# Patient Record
Sex: Female | Born: 1980 | Race: White | Hispanic: No | Marital: Married | Smoking: Former smoker
Health system: Southern US, Community
[De-identification: ages and names within clinical notes are randomized; demographics above are authoritative.]

## PROBLEM LIST (undated history)

## (undated) DIAGNOSIS — Z8632 Personal history of gestational diabetes: Secondary | ICD-10-CM

## (undated) DIAGNOSIS — F988 Other specified behavioral and emotional disorders with onset usually occurring in childhood and adolescence: Secondary | ICD-10-CM

## (undated) DIAGNOSIS — F419 Anxiety disorder, unspecified: Secondary | ICD-10-CM

## (undated) HISTORY — PX: OTHER SURGICAL HISTORY: SHX169

## (undated) HISTORY — DX: Personal history of gestational diabetes: Z86.32

## (undated) HISTORY — PX: WISDOM TOOTH EXTRACTION: SHX21

---
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
## 2002-09-16 ENCOUNTER — Other Ambulatory Visit: Admission: RE | Admit: 2002-09-16 | Discharge: 2002-09-16 | Payer: Self-pay | Admitting: Obstetrics and Gynecology

## 2003-11-16 ENCOUNTER — Other Ambulatory Visit: Admission: RE | Admit: 2003-11-16 | Discharge: 2003-11-16 | Payer: Self-pay | Admitting: Obstetrics and Gynecology

## 2004-11-18 ENCOUNTER — Other Ambulatory Visit: Admission: RE | Admit: 2004-11-18 | Discharge: 2004-11-18 | Payer: Self-pay | Admitting: Obstetrics and Gynecology

## 2005-12-11 ENCOUNTER — Other Ambulatory Visit: Admission: RE | Admit: 2005-12-11 | Discharge: 2005-12-11 | Payer: Self-pay | Admitting: Obstetrics and Gynecology

## 2008-09-21 ENCOUNTER — Ambulatory Visit (HOSPITAL_COMMUNITY): Admission: RE | Admit: 2008-09-21 | Discharge: 2008-09-21 | Payer: Self-pay | Admitting: Obstetrics and Gynecology

## 2008-10-19 ENCOUNTER — Ambulatory Visit (HOSPITAL_COMMUNITY): Admission: RE | Admit: 2008-10-19 | Discharge: 2008-10-19 | Payer: Self-pay | Admitting: Obstetrics and Gynecology

## 2008-10-24 ENCOUNTER — Encounter: Admission: RE | Admit: 2008-10-24 | Discharge: 2008-10-24 | Payer: Self-pay | Admitting: Obstetrics and Gynecology

## 2008-11-16 ENCOUNTER — Ambulatory Visit (HOSPITAL_COMMUNITY): Admission: RE | Admit: 2008-11-16 | Discharge: 2008-11-16 | Payer: Self-pay | Admitting: Obstetrics and Gynecology

## 2008-12-27 ENCOUNTER — Inpatient Hospital Stay (HOSPITAL_COMMUNITY): Admission: AD | Admit: 2008-12-27 | Discharge: 2009-01-01 | Payer: Self-pay | Admitting: Obstetrics and Gynecology

## 2010-10-10 ENCOUNTER — Ambulatory Visit (HOSPITAL_COMMUNITY)
Admission: RE | Admit: 2010-10-10 | Discharge: 2010-10-10 | Payer: Self-pay | Source: Home / Self Care | Attending: Obstetrics and Gynecology | Admitting: Obstetrics and Gynecology

## 2010-10-30 ENCOUNTER — Encounter
Admission: RE | Admit: 2010-10-30 | Discharge: 2010-11-05 | Payer: Self-pay | Source: Home / Self Care | Attending: Obstetrics and Gynecology | Admitting: Obstetrics and Gynecology

## 2010-10-31 ENCOUNTER — Other Ambulatory Visit (HOSPITAL_COMMUNITY): Payer: Self-pay | Admitting: Maternal and Fetal Medicine

## 2010-10-31 ENCOUNTER — Ambulatory Visit (HOSPITAL_COMMUNITY)
Admission: RE | Admit: 2010-10-31 | Discharge: 2010-10-31 | Payer: Self-pay | Source: Home / Self Care | Attending: Obstetrics and Gynecology | Admitting: Obstetrics and Gynecology

## 2010-10-31 DIAGNOSIS — O358XX0 Maternal care for other (suspected) fetal abnormality and damage, not applicable or unspecified: Secondary | ICD-10-CM

## 2010-10-31 DIAGNOSIS — O34219 Maternal care for unspecified type scar from previous cesarean delivery: Secondary | ICD-10-CM

## 2010-11-04 ENCOUNTER — Other Ambulatory Visit (HOSPITAL_COMMUNITY): Payer: Self-pay | Admitting: Maternal and Fetal Medicine

## 2010-11-04 ENCOUNTER — Ambulatory Visit (HOSPITAL_COMMUNITY)
Admission: RE | Admit: 2010-11-04 | Discharge: 2010-11-04 | Payer: Self-pay | Source: Home / Self Care | Attending: Obstetrics and Gynecology | Admitting: Obstetrics and Gynecology

## 2010-11-04 DIAGNOSIS — O34219 Maternal care for unspecified type scar from previous cesarean delivery: Secondary | ICD-10-CM

## 2010-11-04 DIAGNOSIS — IMO0001 Reserved for inherently not codable concepts without codable children: Secondary | ICD-10-CM

## 2010-11-04 DIAGNOSIS — O358XX0 Maternal care for other (suspected) fetal abnormality and damage, not applicable or unspecified: Secondary | ICD-10-CM

## 2010-11-06 ENCOUNTER — Ambulatory Visit (HOSPITAL_COMMUNITY)
Admission: RE | Admit: 2010-11-06 | Discharge: 2010-11-06 | Disposition: A | Discharge status: Home / Self Care | Payer: Commercial Managed Care - PPO | Source: Ambulatory Visit | Attending: Maternal and Fetal Medicine | Admitting: Maternal and Fetal Medicine

## 2010-11-06 ENCOUNTER — Other Ambulatory Visit (HOSPITAL_COMMUNITY): Payer: Self-pay

## 2010-11-06 DIAGNOSIS — O34219 Maternal care for unspecified type scar from previous cesarean delivery: Secondary | ICD-10-CM

## 2010-11-06 DIAGNOSIS — O358XX0 Maternal care for other (suspected) fetal abnormality and damage, not applicable or unspecified: Secondary | ICD-10-CM

## 2010-11-06 DIAGNOSIS — Z3689 Encounter for other specified antenatal screening: Secondary | ICD-10-CM | POA: Insufficient documentation

## 2010-11-07 ENCOUNTER — Ambulatory Visit (HOSPITAL_COMMUNITY): Payer: Self-pay

## 2010-11-07 ENCOUNTER — Other Ambulatory Visit (HOSPITAL_COMMUNITY): Payer: Self-pay

## 2010-11-08 ENCOUNTER — Ambulatory Visit (HOSPITAL_COMMUNITY)
Admission: RE | Admit: 2010-11-08 | Discharge: 2010-11-08 | Disposition: A | Discharge status: Home / Self Care | Payer: 59 | Source: Ambulatory Visit | Attending: Maternal and Fetal Medicine | Admitting: Maternal and Fetal Medicine

## 2010-11-08 ENCOUNTER — Other Ambulatory Visit (HOSPITAL_COMMUNITY): Payer: Self-pay | Admitting: Maternal and Fetal Medicine

## 2010-11-08 ENCOUNTER — Ambulatory Visit (HOSPITAL_COMMUNITY): Admission: RE | Admit: 2010-11-08 | Payer: Self-pay | Source: Ambulatory Visit

## 2010-11-08 DIAGNOSIS — O34219 Maternal care for unspecified type scar from previous cesarean delivery: Secondary | ICD-10-CM | POA: Insufficient documentation

## 2010-11-08 DIAGNOSIS — O358XX0 Maternal care for other (suspected) fetal abnormality and damage, not applicable or unspecified: Secondary | ICD-10-CM

## 2010-11-08 DIAGNOSIS — Z3689 Encounter for other specified antenatal screening: Secondary | ICD-10-CM | POA: Insufficient documentation

## 2010-11-11 ENCOUNTER — Ambulatory Visit (HOSPITAL_COMMUNITY)
Admission: RE | Admit: 2010-11-11 | Discharge: 2010-11-11 | Disposition: A | Discharge status: Home / Self Care | Payer: 59 | Source: Ambulatory Visit | Attending: Maternal and Fetal Medicine | Admitting: Maternal and Fetal Medicine

## 2010-11-11 DIAGNOSIS — O34219 Maternal care for unspecified type scar from previous cesarean delivery: Secondary | ICD-10-CM | POA: Insufficient documentation

## 2010-11-11 DIAGNOSIS — O358XX0 Maternal care for other (suspected) fetal abnormality and damage, not applicable or unspecified: Secondary | ICD-10-CM

## 2010-11-11 DIAGNOSIS — Z3689 Encounter for other specified antenatal screening: Secondary | ICD-10-CM | POA: Insufficient documentation

## 2010-11-13 ENCOUNTER — Ambulatory Visit (HOSPITAL_COMMUNITY)
Admission: RE | Admit: 2010-11-13 | Discharge: 2010-11-13 | Disposition: A | Discharge status: Home / Self Care | Payer: 59 | Source: Ambulatory Visit | Attending: Maternal and Fetal Medicine | Admitting: Maternal and Fetal Medicine

## 2010-11-13 DIAGNOSIS — O34219 Maternal care for unspecified type scar from previous cesarean delivery: Secondary | ICD-10-CM

## 2010-11-13 DIAGNOSIS — O358XX0 Maternal care for other (suspected) fetal abnormality and damage, not applicable or unspecified: Secondary | ICD-10-CM

## 2010-11-14 ENCOUNTER — Ambulatory Visit (HOSPITAL_COMMUNITY): Payer: Self-pay

## 2010-11-14 ENCOUNTER — Other Ambulatory Visit (HOSPITAL_COMMUNITY): Payer: Self-pay

## 2010-11-15 ENCOUNTER — Other Ambulatory Visit (HOSPITAL_COMMUNITY): Payer: Self-pay

## 2010-11-15 ENCOUNTER — Ambulatory Visit (HOSPITAL_COMMUNITY)
Admission: RE | Admit: 2010-11-15 | Discharge: 2010-11-15 | Disposition: A | Discharge status: Home / Self Care | Payer: 59 | Source: Ambulatory Visit | Attending: Maternal and Fetal Medicine | Admitting: Maternal and Fetal Medicine

## 2010-11-15 ENCOUNTER — Ambulatory Visit (HOSPITAL_COMMUNITY): Payer: Self-pay

## 2010-11-15 DIAGNOSIS — Z3689 Encounter for other specified antenatal screening: Secondary | ICD-10-CM | POA: Insufficient documentation

## 2010-11-15 DIAGNOSIS — O358XX0 Maternal care for other (suspected) fetal abnormality and damage, not applicable or unspecified: Secondary | ICD-10-CM

## 2010-11-15 DIAGNOSIS — O34219 Maternal care for unspecified type scar from previous cesarean delivery: Secondary | ICD-10-CM

## 2010-11-18 ENCOUNTER — Ambulatory Visit (HOSPITAL_COMMUNITY): Payer: Self-pay

## 2010-11-18 ENCOUNTER — Ambulatory Visit (HOSPITAL_COMMUNITY)
Admission: RE | Admit: 2010-11-18 | Discharge: 2010-11-18 | Disposition: A | Discharge status: Home / Self Care | Payer: 59 | Source: Ambulatory Visit | Attending: Maternal and Fetal Medicine | Admitting: Maternal and Fetal Medicine

## 2010-11-18 ENCOUNTER — Other Ambulatory Visit (HOSPITAL_COMMUNITY): Payer: Self-pay

## 2010-11-18 DIAGNOSIS — O358XX0 Maternal care for other (suspected) fetal abnormality and damage, not applicable or unspecified: Secondary | ICD-10-CM

## 2010-11-18 DIAGNOSIS — O99891 Other specified diseases and conditions complicating pregnancy: Secondary | ICD-10-CM | POA: Insufficient documentation

## 2010-11-18 DIAGNOSIS — O34219 Maternal care for unspecified type scar from previous cesarean delivery: Secondary | ICD-10-CM

## 2010-11-18 DIAGNOSIS — Z3689 Encounter for other specified antenatal screening: Secondary | ICD-10-CM | POA: Insufficient documentation

## 2010-11-20 ENCOUNTER — Ambulatory Visit (HOSPITAL_COMMUNITY)
Admission: RE | Admit: 2010-11-20 | Discharge: 2010-11-20 | Disposition: A | Discharge status: Home / Self Care | Payer: 59 | Source: Ambulatory Visit | Attending: Maternal and Fetal Medicine | Admitting: Maternal and Fetal Medicine

## 2010-11-20 ENCOUNTER — Other Ambulatory Visit (HOSPITAL_COMMUNITY): Payer: Self-pay | Admitting: Maternal and Fetal Medicine

## 2010-11-20 DIAGNOSIS — O34219 Maternal care for unspecified type scar from previous cesarean delivery: Secondary | ICD-10-CM

## 2010-11-20 DIAGNOSIS — Z3689 Encounter for other specified antenatal screening: Secondary | ICD-10-CM | POA: Insufficient documentation

## 2010-11-20 DIAGNOSIS — O358XX0 Maternal care for other (suspected) fetal abnormality and damage, not applicable or unspecified: Secondary | ICD-10-CM

## 2010-11-20 DIAGNOSIS — Z09 Encounter for follow-up examination after completed treatment for conditions other than malignant neoplasm: Secondary | ICD-10-CM

## 2010-11-22 ENCOUNTER — Ambulatory Visit (HOSPITAL_COMMUNITY)
Admission: RE | Admit: 2010-11-22 | Discharge: 2010-11-22 | Disposition: A | Discharge status: Home / Self Care | Payer: 59 | Source: Ambulatory Visit | Attending: Maternal and Fetal Medicine | Admitting: Maternal and Fetal Medicine

## 2010-11-22 ENCOUNTER — Encounter (HOSPITAL_COMMUNITY): Payer: Self-pay

## 2010-11-22 DIAGNOSIS — IMO0001 Reserved for inherently not codable concepts without codable children: Secondary | ICD-10-CM

## 2010-11-22 DIAGNOSIS — O34219 Maternal care for unspecified type scar from previous cesarean delivery: Secondary | ICD-10-CM

## 2010-11-22 DIAGNOSIS — O358XX0 Maternal care for other (suspected) fetal abnormality and damage, not applicable or unspecified: Secondary | ICD-10-CM | POA: Insufficient documentation

## 2010-11-22 DIAGNOSIS — Z3689 Encounter for other specified antenatal screening: Secondary | ICD-10-CM | POA: Insufficient documentation

## 2010-11-25 ENCOUNTER — Ambulatory Visit (HOSPITAL_COMMUNITY)
Admission: RE | Admit: 2010-11-25 | Discharge: 2010-11-25 | Disposition: A | Discharge status: Home / Self Care | Payer: 59 | Source: Ambulatory Visit | Attending: Maternal and Fetal Medicine | Admitting: Maternal and Fetal Medicine

## 2010-11-25 ENCOUNTER — Other Ambulatory Visit (HOSPITAL_COMMUNITY): Payer: Self-pay | Admitting: Maternal and Fetal Medicine

## 2010-11-25 DIAGNOSIS — Z09 Encounter for follow-up examination after completed treatment for conditions other than malignant neoplasm: Secondary | ICD-10-CM

## 2010-11-25 DIAGNOSIS — O358XX0 Maternal care for other (suspected) fetal abnormality and damage, not applicable or unspecified: Secondary | ICD-10-CM | POA: Insufficient documentation

## 2010-11-25 DIAGNOSIS — Z3689 Encounter for other specified antenatal screening: Secondary | ICD-10-CM | POA: Insufficient documentation

## 2010-11-26 ENCOUNTER — Other Ambulatory Visit (HOSPITAL_COMMUNITY): Payer: Commercial Managed Care - PPO

## 2010-11-27 ENCOUNTER — Other Ambulatory Visit (HOSPITAL_COMMUNITY): Payer: Commercial Managed Care - PPO

## 2010-11-29 ENCOUNTER — Ambulatory Visit (HOSPITAL_COMMUNITY)
Admission: RE | Admit: 2010-11-29 | Discharge: 2010-11-29 | Disposition: A | Discharge status: Home / Self Care | Payer: 59 | Source: Ambulatory Visit | Attending: Maternal and Fetal Medicine | Admitting: Maternal and Fetal Medicine

## 2010-11-29 DIAGNOSIS — Z09 Encounter for follow-up examination after completed treatment for conditions other than malignant neoplasm: Secondary | ICD-10-CM

## 2010-11-29 DIAGNOSIS — Z3689 Encounter for other specified antenatal screening: Secondary | ICD-10-CM | POA: Insufficient documentation

## 2010-11-29 DIAGNOSIS — O34219 Maternal care for unspecified type scar from previous cesarean delivery: Secondary | ICD-10-CM | POA: Insufficient documentation

## 2010-11-29 DIAGNOSIS — O358XX0 Maternal care for other (suspected) fetal abnormality and damage, not applicable or unspecified: Secondary | ICD-10-CM | POA: Insufficient documentation

## 2010-12-02 ENCOUNTER — Ambulatory Visit (HOSPITAL_COMMUNITY)
Admission: RE | Admit: 2010-12-02 | Discharge: 2010-12-02 | Disposition: A | Discharge status: Home / Self Care | Payer: 59 | Source: Ambulatory Visit | Attending: Maternal and Fetal Medicine | Admitting: Maternal and Fetal Medicine

## 2010-12-02 DIAGNOSIS — Z09 Encounter for follow-up examination after completed treatment for conditions other than malignant neoplasm: Secondary | ICD-10-CM

## 2010-12-02 DIAGNOSIS — Z3689 Encounter for other specified antenatal screening: Secondary | ICD-10-CM | POA: Insufficient documentation

## 2010-12-02 DIAGNOSIS — O358XX0 Maternal care for other (suspected) fetal abnormality and damage, not applicable or unspecified: Secondary | ICD-10-CM | POA: Insufficient documentation

## 2010-12-02 DIAGNOSIS — O34219 Maternal care for unspecified type scar from previous cesarean delivery: Secondary | ICD-10-CM | POA: Insufficient documentation

## 2010-12-03 ENCOUNTER — Other Ambulatory Visit (HOSPITAL_COMMUNITY): Payer: Commercial Managed Care - PPO

## 2010-12-04 ENCOUNTER — Ambulatory Visit (HOSPITAL_COMMUNITY)
Admission: RE | Admit: 2010-12-04 | Discharge: 2010-12-04 | Disposition: A | Discharge status: Home / Self Care | Payer: 59 | Source: Ambulatory Visit | Attending: Obstetrics and Gynecology | Admitting: Obstetrics and Gynecology

## 2010-12-04 DIAGNOSIS — Z3689 Encounter for other specified antenatal screening: Secondary | ICD-10-CM | POA: Insufficient documentation

## 2010-12-04 DIAGNOSIS — O34219 Maternal care for unspecified type scar from previous cesarean delivery: Secondary | ICD-10-CM | POA: Insufficient documentation

## 2010-12-04 DIAGNOSIS — Z09 Encounter for follow-up examination after completed treatment for conditions other than malignant neoplasm: Secondary | ICD-10-CM

## 2010-12-04 DIAGNOSIS — O358XX0 Maternal care for other (suspected) fetal abnormality and damage, not applicable or unspecified: Secondary | ICD-10-CM | POA: Insufficient documentation

## 2010-12-06 ENCOUNTER — Ambulatory Visit (HOSPITAL_COMMUNITY)
Admission: RE | Admit: 2010-12-06 | Discharge: 2010-12-06 | Disposition: A | Discharge status: Home / Self Care | Payer: 59 | Source: Ambulatory Visit | Attending: Obstetrics and Gynecology | Admitting: Obstetrics and Gynecology

## 2010-12-06 DIAGNOSIS — Z3689 Encounter for other specified antenatal screening: Secondary | ICD-10-CM | POA: Insufficient documentation

## 2010-12-06 DIAGNOSIS — O358XX0 Maternal care for other (suspected) fetal abnormality and damage, not applicable or unspecified: Secondary | ICD-10-CM | POA: Insufficient documentation

## 2010-12-06 DIAGNOSIS — O34219 Maternal care for unspecified type scar from previous cesarean delivery: Secondary | ICD-10-CM | POA: Insufficient documentation

## 2010-12-06 DIAGNOSIS — Z09 Encounter for follow-up examination after completed treatment for conditions other than malignant neoplasm: Secondary | ICD-10-CM

## 2010-12-09 ENCOUNTER — Ambulatory Visit (HOSPITAL_COMMUNITY)
Admission: RE | Admit: 2010-12-09 | Discharge: 2010-12-09 | Disposition: A | Discharge status: Home / Self Care | Payer: 59 | Source: Ambulatory Visit | Attending: Obstetrics and Gynecology | Admitting: Obstetrics and Gynecology

## 2010-12-09 DIAGNOSIS — Z09 Encounter for follow-up examination after completed treatment for conditions other than malignant neoplasm: Secondary | ICD-10-CM

## 2010-12-09 DIAGNOSIS — O34219 Maternal care for unspecified type scar from previous cesarean delivery: Secondary | ICD-10-CM | POA: Insufficient documentation

## 2010-12-09 DIAGNOSIS — Z3689 Encounter for other specified antenatal screening: Secondary | ICD-10-CM | POA: Insufficient documentation

## 2010-12-11 ENCOUNTER — Ambulatory Visit (HOSPITAL_COMMUNITY)
Admission: RE | Admit: 2010-12-11 | Discharge: 2010-12-11 | Disposition: A | Discharge status: Home / Self Care | Payer: 59 | Source: Ambulatory Visit | Attending: Obstetrics and Gynecology | Admitting: Obstetrics and Gynecology

## 2010-12-11 DIAGNOSIS — O34219 Maternal care for unspecified type scar from previous cesarean delivery: Secondary | ICD-10-CM | POA: Insufficient documentation

## 2010-12-11 DIAGNOSIS — Z3689 Encounter for other specified antenatal screening: Secondary | ICD-10-CM | POA: Insufficient documentation

## 2010-12-11 DIAGNOSIS — Z09 Encounter for follow-up examination after completed treatment for conditions other than malignant neoplasm: Secondary | ICD-10-CM

## 2010-12-13 ENCOUNTER — Other Ambulatory Visit (HOSPITAL_COMMUNITY): Payer: Self-pay | Admitting: Obstetrics and Gynecology

## 2010-12-13 ENCOUNTER — Ambulatory Visit (HOSPITAL_COMMUNITY)
Admission: RE | Admit: 2010-12-13 | Discharge: 2010-12-13 | Disposition: A | Discharge status: Home / Self Care | Payer: 59 | Source: Ambulatory Visit | Attending: Obstetrics and Gynecology | Admitting: Obstetrics and Gynecology

## 2010-12-13 DIAGNOSIS — Z09 Encounter for follow-up examination after completed treatment for conditions other than malignant neoplasm: Secondary | ICD-10-CM

## 2010-12-13 DIAGNOSIS — O358XX0 Maternal care for other (suspected) fetal abnormality and damage, not applicable or unspecified: Secondary | ICD-10-CM

## 2010-12-13 DIAGNOSIS — Z3689 Encounter for other specified antenatal screening: Secondary | ICD-10-CM | POA: Insufficient documentation

## 2010-12-13 DIAGNOSIS — O34219 Maternal care for unspecified type scar from previous cesarean delivery: Secondary | ICD-10-CM | POA: Insufficient documentation

## 2010-12-16 ENCOUNTER — Ambulatory Visit (HOSPITAL_COMMUNITY)
Admission: RE | Admit: 2010-12-16 | Discharge: 2010-12-16 | Disposition: A | Discharge status: Home / Self Care | Payer: 59 | Source: Ambulatory Visit | Attending: Obstetrics and Gynecology | Admitting: Obstetrics and Gynecology

## 2010-12-16 ENCOUNTER — Ambulatory Visit (HOSPITAL_COMMUNITY): Payer: Commercial Managed Care - PPO

## 2010-12-16 DIAGNOSIS — O34219 Maternal care for unspecified type scar from previous cesarean delivery: Secondary | ICD-10-CM | POA: Insufficient documentation

## 2010-12-16 DIAGNOSIS — O358XX0 Maternal care for other (suspected) fetal abnormality and damage, not applicable or unspecified: Secondary | ICD-10-CM | POA: Insufficient documentation

## 2010-12-17 ENCOUNTER — Other Ambulatory Visit (HOSPITAL_COMMUNITY): Payer: Commercial Managed Care - PPO

## 2010-12-18 ENCOUNTER — Ambulatory Visit (HOSPITAL_COMMUNITY)
Admission: RE | Admit: 2010-12-18 | Discharge: 2010-12-18 | Disposition: A | Discharge status: Home / Self Care | Payer: 59 | Source: Ambulatory Visit | Attending: Obstetrics and Gynecology | Admitting: Obstetrics and Gynecology

## 2010-12-18 ENCOUNTER — Other Ambulatory Visit (HOSPITAL_COMMUNITY): Payer: Commercial Managed Care - PPO

## 2010-12-18 ENCOUNTER — Other Ambulatory Visit: Payer: Commercial Managed Care - PPO

## 2010-12-18 ENCOUNTER — Encounter (HOSPITAL_COMMUNITY): Payer: 59

## 2010-12-18 DIAGNOSIS — O34219 Maternal care for unspecified type scar from previous cesarean delivery: Secondary | ICD-10-CM | POA: Insufficient documentation

## 2010-12-18 DIAGNOSIS — O358XX0 Maternal care for other (suspected) fetal abnormality and damage, not applicable or unspecified: Secondary | ICD-10-CM | POA: Insufficient documentation

## 2010-12-18 DIAGNOSIS — Z3689 Encounter for other specified antenatal screening: Secondary | ICD-10-CM | POA: Insufficient documentation

## 2010-12-18 DIAGNOSIS — Z09 Encounter for follow-up examination after completed treatment for conditions other than malignant neoplasm: Secondary | ICD-10-CM

## 2010-12-20 ENCOUNTER — Other Ambulatory Visit (HOSPITAL_COMMUNITY): Payer: Self-pay | Admitting: Obstetrics and Gynecology

## 2010-12-20 ENCOUNTER — Ambulatory Visit (HOSPITAL_COMMUNITY)
Admission: RE | Admit: 2010-12-20 | Discharge: 2010-12-20 | Disposition: A | Discharge status: Home / Self Care | Payer: 59 | Source: Ambulatory Visit | Attending: Obstetrics and Gynecology | Admitting: Obstetrics and Gynecology

## 2010-12-20 ENCOUNTER — Other Ambulatory Visit (HOSPITAL_COMMUNITY): Payer: Commercial Managed Care - PPO

## 2010-12-20 DIAGNOSIS — O2690 Pregnancy related conditions, unspecified, unspecified trimester: Secondary | ICD-10-CM

## 2010-12-20 DIAGNOSIS — O34219 Maternal care for unspecified type scar from previous cesarean delivery: Secondary | ICD-10-CM | POA: Insufficient documentation

## 2010-12-20 DIAGNOSIS — O358XX0 Maternal care for other (suspected) fetal abnormality and damage, not applicable or unspecified: Secondary | ICD-10-CM | POA: Insufficient documentation

## 2010-12-20 DIAGNOSIS — Z3689 Encounter for other specified antenatal screening: Secondary | ICD-10-CM | POA: Insufficient documentation

## 2010-12-23 ENCOUNTER — Encounter (HOSPITAL_COMMUNITY)
Admission: RE | Admit: 2010-12-23 | Discharge: 2010-12-23 | Disposition: A | Discharge status: Home / Self Care | Payer: 59 | Source: Ambulatory Visit | Attending: Obstetrics and Gynecology | Admitting: Obstetrics and Gynecology

## 2010-12-23 ENCOUNTER — Other Ambulatory Visit (HOSPITAL_COMMUNITY): Payer: Commercial Managed Care - PPO

## 2010-12-23 DIAGNOSIS — O34219 Maternal care for unspecified type scar from previous cesarean delivery: Secondary | ICD-10-CM | POA: Insufficient documentation

## 2010-12-23 DIAGNOSIS — O358XX0 Maternal care for other (suspected) fetal abnormality and damage, not applicable or unspecified: Secondary | ICD-10-CM | POA: Insufficient documentation

## 2010-12-23 DIAGNOSIS — Z3689 Encounter for other specified antenatal screening: Secondary | ICD-10-CM | POA: Insufficient documentation

## 2010-12-23 DIAGNOSIS — O2690 Pregnancy related conditions, unspecified, unspecified trimester: Secondary | ICD-10-CM

## 2010-12-25 ENCOUNTER — Encounter (HOSPITAL_COMMUNITY)
Admission: RE | Admit: 2010-12-25 | Discharge: 2010-12-25 | Disposition: A | Discharge status: Home / Self Care | Payer: 59 | Source: Ambulatory Visit | Attending: Obstetrics and Gynecology | Admitting: Obstetrics and Gynecology

## 2010-12-25 ENCOUNTER — Encounter (HOSPITAL_COMMUNITY): Payer: 59

## 2010-12-25 ENCOUNTER — Ambulatory Visit (HOSPITAL_COMMUNITY): Payer: Commercial Managed Care - PPO

## 2010-12-25 ENCOUNTER — Other Ambulatory Visit (HOSPITAL_COMMUNITY): Payer: Commercial Managed Care - PPO

## 2010-12-25 DIAGNOSIS — Z01812 Encounter for preprocedural laboratory examination: Secondary | ICD-10-CM | POA: Insufficient documentation

## 2010-12-25 DIAGNOSIS — O358XX0 Maternal care for other (suspected) fetal abnormality and damage, not applicable or unspecified: Secondary | ICD-10-CM

## 2010-12-25 LAB — CBC
HCT: 40.2 % (ref 36.0–46.0)
Hemoglobin: 13.6 g/dL (ref 12.0–15.0)
WBC: 11.7 10*3/uL — ABNORMAL HIGH (ref 4.0–10.5)

## 2010-12-25 LAB — BASIC METABOLIC PANEL
CO2: 19 mEq/L (ref 19–32)
Glucose, Bld: 102 mg/dL — ABNORMAL HIGH (ref 70–99)
Potassium: 3.5 mEq/L (ref 3.5–5.1)
Sodium: 135 mEq/L (ref 135–145)

## 2010-12-25 LAB — RPR: RPR Ser Ql: NONREACTIVE

## 2010-12-27 ENCOUNTER — Encounter (HOSPITAL_COMMUNITY)
Admission: RE | Admit: 2010-12-27 | Discharge: 2010-12-27 | Disposition: A | Discharge status: Home / Self Care | Payer: 59 | Source: Ambulatory Visit | Attending: Obstetrics and Gynecology | Admitting: Obstetrics and Gynecology

## 2010-12-27 ENCOUNTER — Other Ambulatory Visit (HOSPITAL_COMMUNITY): Payer: Commercial Managed Care - PPO

## 2010-12-27 DIAGNOSIS — O2690 Pregnancy related conditions, unspecified, unspecified trimester: Secondary | ICD-10-CM

## 2010-12-27 DIAGNOSIS — O34219 Maternal care for unspecified type scar from previous cesarean delivery: Secondary | ICD-10-CM

## 2010-12-28 LAB — MRSA CULTURE

## 2010-12-30 ENCOUNTER — Encounter (HOSPITAL_COMMUNITY): Payer: 59

## 2010-12-30 ENCOUNTER — Inpatient Hospital Stay (HOSPITAL_COMMUNITY)
Admission: RE | Admit: 2010-12-30 | Discharge: 2011-01-02 | DRG: 766 | Disposition: A | Discharge status: Home / Self Care | Payer: 59 | Source: Ambulatory Visit | Attending: Obstetrics and Gynecology | Admitting: Obstetrics and Gynecology

## 2010-12-30 DIAGNOSIS — Z01812 Encounter for preprocedural laboratory examination: Secondary | ICD-10-CM

## 2010-12-30 DIAGNOSIS — Z01818 Encounter for other preprocedural examination: Secondary | ICD-10-CM

## 2010-12-30 DIAGNOSIS — O34219 Maternal care for unspecified type scar from previous cesarean delivery: Principal | ICD-10-CM | POA: Diagnosis present

## 2010-12-30 LAB — ABO/RH: ABO/RH(D): O POS

## 2010-12-31 LAB — CBC
Hemoglobin: 13 g/dL (ref 12.0–15.0)
MCH: 28.3 pg (ref 26.0–34.0)
MCV: 87.4 fL (ref 78.0–100.0)
RBC: 4.59 MIL/uL (ref 3.87–5.11)

## 2011-01-01 ENCOUNTER — Encounter (HOSPITAL_COMMUNITY): Payer: 59

## 2011-01-01 NOTE — H&P (Addendum)
Lindsay [name], [name] NO.:  [id number]  MEDICAL RECORD NO.:  [id number]           PATIENT TYPE:  O  LOCATION:  MFM                           FACILITY:  [facility]  PHYSICIAN:  Sherron Monday, MD        DATE OF BIRTH:  1981/05/20  DATE OF ADMISSION:  12/30/2010 DATE OF DISCHARGE:  12/02/2010                             HISTORY & PHYSICAL   ADMISSION DIAGNOSIS:  Intrauterine pregnancy at 37 weeks, umbilical vein varix, history of low transverse cesarean section desires a repeat.  PROCEDURE PLANNED:  Repeat low transverse cesarean section.  HISTORY OF PRESENT ILLNESS:  A 30 year old G2, P 1-0-0-1 at 37 plus weeks for repeat low transverse cesarean section secondary to umbilical vein varix as well as history of cesarean sections, desiring a repeat. The umbilical vein varix was discovered on scan with maternal fetal medicine and they have been monitoring it with 3 times weekly BPPs and by monitoring it has not enlarged in such.  She has also been complicated by gestational diabetes mellitus.  She states she has had good fetal movement.  No loss of fluid.  No vaginal bleeding and occasional contractions.  PAST MEDICAL HISTORY:  Significant for gestational diabetes at her previous pregnancy.  PAST SURGICAL HISTORY:  Significant for cesarean section and wisdom tooth extraction.  PAST OB/GYN HISTORY:  She has a G2, P1-0-0-1 with a term delivery of 57- week infant female, weighing 7 pounds 6 ounces via cesarean section and failure to progress due to the present pregnancy.  MEDICATIONS:  Include prenatal vitamins.  ALLERGIES:  No known drug allergies.  SOCIAL HISTORY:  Denies alcohol, tobacco or drug use.  She is married and works as a Buyer, retail.  FAMILY MEDICAL HISTORY:  Significant for breast cancer, heart disease, stroke, diabetes, hypertension and thyroid dysfunction.  PRENATAL LABORATORY DATA:  She is O+, antibody screen negative. Hemoglobin 14.1.   Pap smear within normal limits.  Rubella immune, RPR nonreactive, hepatitis B surface antigen negative, HIV negative, platelets 235,000.  Gonorrhea negative, Chlamydia negative.  First trimester screen within normal limits.  Carrier screen negative and AFP also within normal limits.  Glucola of 168 at 3-hour that was initially normal.  Follow up was elevated, I am not sure of the values.  She has had BPPs 8/8.  Her group B strep status is negative.  Her ultrasound revealed normal anatomy with followup female infant.  PHYSICAL EXAMINATION:  VITAL SIGNS:  She is afebrile.  Vital signs stable. GENERAL:  No apparent distress. CARDIOVASCULAR:  Regular rate and rhythm. LUNGS:  Clear to auscultation bilaterally. ABDOMEN:  Soft.  Fundus is nontender. EXTREMITIES:  Symmetric and nontender.  ASSESSMENT AND PLAN:  A 30 year old G2, P1-0-0-1 at 37 weeks for repeat low transverse cesarean section given umbilical vein varix and history of cesarean section.  She was understanding when we discussed the risks, benefits and alternatives of delivery.  Her pregnancy has been complicated by gestational diabetes, which has been fairly well controlled.     Sherron Monday, MD     JB/MEDQ  D:  12/27/2010  T:  12/27/2010  Job:  [id number]  Electronically Signed by Sherron Monday MD on 03/06/2011 10:38:11 AM

## 2011-01-01 NOTE — Op Note (Signed)
Lindsay [name], Lindsay [name]            ACCOUNT NO.:  [id number]  MEDICAL RECORD NO.:  [id number]           PATIENT TYPE:  I  LOCATION:  9147                          [facility]ACILITY:  [facility]  PHYSICIAN:  Sherron Monday, MD        DATE OF BIRTH:  [birth year]  DATE OF PROCEDURE:  12/30/2010 DATE OF DISCHARGE:                              OPERATIVE REPORT   PREOPERATIVE DIAGNOSES: 1. Intrauterine pregnancy at 37 plus weeks. 2. Umbilical vein varix. 3. History of low transverse cesarean section, desires repeat.  POSTOPERATIVE DIAGNOSES: 1. Intrauterine pregnancy at 37 plus weeks, delivered. 2. Umbilical vein varix. 3. History of low transverse cesarean section, desires repeat.  PROCEDURE:  Repeat low transverse cesarean section.  SURGEON:  Sherron Monday, MD  ASSISTANT:  Zenaida Niece, MD  ANESTHESIA:  Spinal.  FINDINGS:  Viable female infant at 9:23 a.m. with Apgars of 8 at 1 minute and 9 at 5 minutes and a weight of 6 pounds 15 ounces.  Normal uterus, tubes, and ovaries.  Placenta to disposal.  ESTIMATED BLOOD LOSS:  600 mL.  IV FLUIDS:  2000 mL.  URINE OUTPUT:  500 mL, clear urine at the end of the procedure.  COMPLICATIONS:  None.  DISPOSITION:  Stable to PACU.  DESCRIPTION OF PROCEDURE:  After informed consent was reviewed with the patient including risks, benefits, and alternatives of surgical procedures, she was transported to the OR where spinal anesthesia was induced and found to be adequate.  She was then prepped and draped in the normal sterile fashion.  A Foley catheter was sterilely placed.  A Pfannenstiel incision was made at the level of her previous Pfannenstiel, carried through the underlying layer of fascia sharply. The fascia was incised in the midline.  Incision was extended laterally with Mayo scissors.  Inferior aspect of the fascial incision was grasped with Kocher clamps, elevating the rectus muscles which were dissected off both bluntly and sharply.   Attention was then turned to the superior portion which in a similar fashion was tented up with Kocher clamps and rectus muscles were dissected off both bluntly and sharply.  Midline was easily identified.  The peritoneum was entered bluntly.  The Alexis skin retractor was placed after carefully making sure no bowel was entrapped. The uterus was explored.  The vesicouterine peritoneum was easily identified, tented up with smooth pickups, and bladder flap was created both digitally and sharply.  Uterus was incised in the transverse fashion.  Infant was delivered from vertex presentation.  Nose and mouth were suctioned on the field.  Cord was clamped and cut.  Infant was handed off to the awaiting pediatric staff.  Placenta was expressed, given to cord blood for cord blood collection.  The uterus was cleared of all clot and debris.  The uterine incision was closed in 2 layers of 0 Monocryl, the first of which is a running locked and second was an imbricating.  The pelvis was copiously irrigated.  Hemostasis was assured.  The Alexis skin retractor was removed.  The peritoneum was reapproximated using 2-0 Vicryl.  The fascia was closed after subfascial planes were inspected and found  to be hemostatic.  Fascia was closed with 0 Vicryl in a running fashion from each corner, overlapping in the midline.  The subcuticular adipose layer was made hemostatic with Bovie cautery, irrigated, and a suture of plain gut was used to close the dead space.  The skin was closed with staples.  The patient tolerated the procedure well.  Sponge, lap, and needle count was correct x2 at the end of the procedure.     Sherron Monday, MD     JB/MEDQ  D:  12/30/2010  T:  12/31/2010  Job:  [id number]  Electronically Signed by Sherron Monday MD on 01/01/2011 04:20:42 PM

## 2011-01-03 ENCOUNTER — Encounter (HOSPITAL_COMMUNITY): Payer: 59

## 2011-01-06 ENCOUNTER — Encounter (HOSPITAL_COMMUNITY): Payer: Commercial Managed Care - PPO

## 2011-01-08 ENCOUNTER — Encounter (HOSPITAL_COMMUNITY): Payer: Commercial Managed Care - PPO

## 2011-01-10 ENCOUNTER — Encounter (HOSPITAL_COMMUNITY): Payer: Commercial Managed Care - PPO

## 2011-01-13 ENCOUNTER — Encounter (HOSPITAL_COMMUNITY): Payer: Commercial Managed Care - PPO

## 2011-01-15 ENCOUNTER — Encounter (HOSPITAL_COMMUNITY): Payer: Commercial Managed Care - PPO

## 2011-01-16 LAB — COMPREHENSIVE METABOLIC PANEL
AST: 22 U/L (ref 0–37)
CO2: 18 mEq/L — ABNORMAL LOW (ref 19–32)
Calcium: 8.5 mg/dL (ref 8.4–10.5)
Creatinine, Ser: 0.64 mg/dL (ref 0.4–1.2)
GFR calc Af Amer: >60 mL/min (ref >60)
GFR calc non Af Amer: >60 mL/min (ref >60)

## 2011-01-16 LAB — GLUCOSE, CAPILLARY
Glucose-Capillary: 102 mg/dL — ABNORMAL HIGH (ref 70–99)
Glucose-Capillary: 72 mg/dL (ref 70–99)
Glucose-Capillary: 74 mg/dL (ref 70–99)
Glucose-Capillary: 82 mg/dL (ref 70–99)

## 2011-01-16 LAB — CBC
HCT: 37.5 % (ref 36.0–46.0)
Hemoglobin: 12.5 g/dL (ref 12.0–15.0)
Hemoglobin: 14.3 g/dL (ref 12.0–15.0)
MCHC: 32.9 g/dL (ref 30.0–36.0)
MCHC: 33.4 g/dL (ref 30.0–36.0)
MCV: 86.3 fL (ref 78.0–100.0)
MCV: 86.5 fL (ref 78.0–100.0)
Platelets: 226 10*3/uL (ref 150–400)
RBC: 4.26 MIL/uL (ref 3.87–5.11)
RBC: 4.98 MIL/uL (ref 3.87–5.11)
RBC: 5.01 MIL/uL (ref 3.87–5.11)
RDW: 14.9 % (ref 11.5–15.5)
WBC: 16.8 10*3/uL — ABNORMAL HIGH (ref 4.0–10.5)

## 2011-01-16 LAB — URIC ACID: Uric Acid, Serum: 6.5 mg/dL (ref 2.4–7.0)

## 2011-01-18 ENCOUNTER — Inpatient Hospital Stay (HOSPITAL_COMMUNITY): Admission: AD | Admit: 2011-01-18 | Payer: Self-pay | Source: Home / Self Care | Admitting: Obstetrics and Gynecology

## 2011-01-20 ENCOUNTER — Encounter (HOSPITAL_COMMUNITY): Payer: Commercial Managed Care - PPO

## 2011-01-22 ENCOUNTER — Encounter (HOSPITAL_COMMUNITY): Payer: Commercial Managed Care - PPO

## 2011-01-22 NOTE — Discharge Summary (Signed)
  NAMEKALYN, HOFSTRA            ACCOUNT NO.:  [id number]  MEDICAL RECORD NO.:  [id number]           PATIENT TYPE:  I  LOCATION:  9147                          [facility]ILITY:  [facility]  PHYSICIAN:  Sherron Monday, MD        DATE OF BIRTH:  [birth year]  DATE OF ADMISSION:  12/30/2010 DATE OF DISCHARGE:  01/02/2011                              DISCHARGE SUMMARY   ADMITTING DIAGNOSES: 1. Intrauterine pregnancy at 37 plus weeks. 2. Umbilical vein varix. 3. History of low-transverse cesarean section, desires repeat.  DISCHARGE DIAGNOSES: 1. Intrauterine pregnancy at 37 plus weeks, delivered. 2. Umbilical vein varix. 3. History of low-transverse cesarean section, desires repeat.  HISTORY OF PRESENT ILLNESS:  For the complete history and physical, please see the dictated note.  However, in brief, this is a 30 year old G2, P1-3-0-1 at 37 plus weeks for repeat low transverse cesarean section secondary to umbilical vein varix.  Her delivery is recommended per Maternal Fetal Medicine recommendations.  The patient has a history of a low transverse cesarean section and desires a repeat.  This was performed without difficulty, delivering a viable female infant at 9:23 on December 30, 2010, with Apgars of 8 at 1 minute and 9 at 5 minutes, and a weight of 6 pounds and 15 ounces.  Normal uterus, tubes, and ovaries are noted.  Her postoperative course was relatively uncomplicated.  She remained afebrile and vital signs were stable throughout.  Her hemoglobin decreased to 13.0.  Her postpartum course was relatively uncomplicated.  As mentioned, her baby did have to go to the NICU for some mild respiratory distress.  Baby was able to be discharged home with her mother on the night of January 02, 2011.  She desired discharge from hospital at this time.  She was discharged from the hospital with routine discharge instructions and numbers to call with any questions or problems as well as prescriptions for Motrin,  Percocet, and prenatal vitamins.  She will follow up in the office in approximately 2 weeks for an incision check.  Her staples were removed prior to discharge and the incision was clean, dry, and intact.     Sherron Monday, MD     JB/MEDQ  D:  01/03/2011  T:  01/03/2011  Job:  [id number]  Electronically Signed by Sherron Monday MD on 01/22/2011 04:29:58 PM

## 2011-02-18 NOTE — Discharge Summary (Signed)
[name], Lindsay [name]            ACCOUNT NO.:  [id number]   MEDICAL RECORD NO.:  [id number]          PATIENT TYPE:  INP   LOCATION:  9141                          [facility]ILITY:  [facility]   PHYSICIAN:  Sherron Monday, MD        DATE OF BIRTH:  March 21, 1981   DATE OF ADMISSION:  12/27/2008  DATE OF DISCHARGE:  01/01/2009                               DISCHARGE SUMMARY   ADMITTING DIAGNOSIS:  Intrauterine pregnancy at term for induction of  labor.   DISCHARGE DIAGNOSIS:  Intrauterine pregnancy at term for induction of  labor, delivered via low transverse cesarean section secondary to  failure to progress.   For a complete history and physical, please see the dictated admission H  and P.  In brief, this is a 30 year old G1, P0, for induction of labor  given pregnancy-induced hypertension as well as gestational diabetes  with an unfavorable cervix.  At the time of admission, it was discussed  with the patient, risks, benefits, and alternatives and possibility of a  cesarean section.  She voiced understanding to all this.  She progressed  in labor and had essentially no cervical change for approximately 8  hours at this point.  After the placement of an IUPC and adjusting her  Pitocin, she was unable to get adequate, but still largely had no  changes, but had originally changed up to 7-8 cm.  She discussed the  risks, benefits, and alternatives of the cesarean section, and she  wished to proceed.  This was performed on December 29, 2008, at  approximately 11 in the morning.  She delivered a viable female infant  at 10:24 with Apgars of 8 at 1-minute and 9 at 5 minutes, and weight was  7 pounds 8 ounces.  Normal uterus, tubes, and ovaries were noted.  EBL  was 500 mL.  Her postoperative course was relatively uncomplicated.  She  remained afebrile with stable vital signs throughout her course and was  discharged to home on postoperative day #3.  At this time, she was  ambulating well, tolerating p.o.,  and she had no complaints.  Her lochia  was within normal limits.  Her hemoglobin had decreased from 14.4-12.5.  Her sugars were followed in labor and a single fasting sugar was checked  and it was found to be within normal limits on December 30, 2008.  She was  discharged to home after staples were removed.  She was given routine  discharge instructions and numbers to call if any questions or problems  as well as routine instructions and prescriptions for Motrin, Vicodin,  and prenatal vitamins.  We will check a 2-hour GTT after a 6-week check.  She is O positive, rubella immune, hemoglobin 14.4-12.5.  She plans to  breast feed and will start Micronor at her 6 week checkup.       Sherron Monday, MD  Electronically Signed     JB/MEDQ  D:  01/01/2009  T:  01/01/2009  Job:  [id number]

## 2011-02-18 NOTE — Op Note (Signed)
NAMEPORSCHA, AXLEY NO.:  [id number]   MEDICAL RECORD NO.:  [id number]           PATIENT TYPE:   LOCATION:                                 FACILITY:   PHYSICIAN:  Sherron Monday, MD        DATE OF BIRTH:  October 01, 1981   DATE OF PROCEDURE:  12/29/2008  DATE OF DISCHARGE:                               OPERATIVE REPORT   PREOPERATIVE DIAGNOSIS:  Intrauterine pregnancy at term with arrest of  dilatation.   POSTOPERATIVE DIAGNOSIS:  Intrauterine pregnancy at term with arrest of  dilatation, delivered via low transverse cesarean section.   PROCEDURE:  Primary low transverse cesarean section.   ANESTHESIA:  Epidural.   SURGEON:  Sherron Monday, MD   FINDINGS:  Viable female infant at 10:24 a.m. with Apgars of 8 at 1 minute  and 9 at 5 minutes and a weight of 7 pounds 8 ounces.  Normal uterus,  tubes, and ovaries.   ESTIMATED BLOOD LOSS:  700 mL.   IV FLUIDS:  1800 mL.   URINE OUTPUT:  200 mL clear urine at the end of the procedure.   PATHOLOGY:  None.   COMPLICATIONS:  None.   DISPOSITION:  Stable to PACU.   PROCEDURE:  After informed consent was reviewed with the patient  including risks, benefits, alternatives of the surgical procedure, she  was transported in stable condition to the OR where her epidural was  dosed and found to be adequate.  She had been transferred to the table  in supine position with a leftward tilt.  The patient was prepped and  draped in the normal fashion.  A Pfannenstiel incision was made  approximately two 2 fingerbreadths above the pubic symphysis and carried  through the underlying layer of fascia sharply.  The fascia was incised,  and the incision was extended laterally with Mayo scissors.  The  inferior aspect of the fascial incision was grasped with Kocher clamps,  elevated, and the rectus muscles were dissected off both bluntly and  sharply.  The superior aspect of fascial incision was grasped with  Kocher clamps, elevated,  and the rectus muscles were dissected off both  bluntly and sharply.  Midline was easily identified, the peritoneum was  entered bluntly.  The incision was extended with good visuaklization.  The Alexis skin retractor was placed and carefully checked to make sure  no bowel was entrapped.  The uterus was incised in a transverse fashion.  A bladder flap was created.  The uterus was incised, and the infant was  delivered from a vertex presentation.  Nose and mouth were suctioned on  the field.  Cord was clamped and cut, and baby was handed off to the  awaiting pediatric staff.  The placenta was expressed and delivered.  The uterus was cleared of all clots and debris.  The uterine incision  was closed with 2 layers of 0 Monocryl, the first of which was running  locked and the second as an imbricating layer.  Hemostasis was assured.  The uterus was returned to the abdominal cavity.  Copious irrigation was  performed.  Hemostasis was assured at the incision, and the peritoneum  was closed with 2-0 Vicryl in a running fashion.  Subfascial planes were  inspected and found to be hemostatic.  The fascia was closed with 0  Vicryl in a running fashion.  The subcuticular adipose layer was made  hemostatic with Bovie cautery.  Irrigation was performed.  A 3-0 plain  gut suture was used to close the subcuticular space.  The skin was  reapproximated with staples.  The patient tolerated the procedure well.  Sponge, lap, and needle counts were correct x2 at the end of the  procedure.      Sherron Monday, MD  Electronically Signed     JB/MEDQ  D:  12/29/2008  T:  12/29/2008  Job:  [id number]

## 2011-02-18 NOTE — H&P (Signed)
NAMEZONNIQUE, NORKUS NO.:  [id number]   MEDICAL RECORD NO.:  [id number]          PATIENT TYPE:  MAT   LOCATION:                                [facility]   PHYSICIAN:  Sherron Monday, MD        DATE OF BIRTH:  [birth year]   DATE OF ADMISSION:  12/30/2008  DATE OF DISCHARGE:                              HISTORY & PHYSICAL   PROCEDURE:  Planned induction of labor with cervical ripening.   ADMISSION DIAGNOSES:  Intrauterine pregnancy at term with gestational  diabetes and pregnancy-induced hypertension.   HISTORY OF PRESENT ILLNESS:  This is a 30 year old Caucasian female, G1,  P0 for induction of labor, given pregnancy-induced hypertension as well  as gestational diabetes with an unfavorable cervix.  She will be given  Cervidil the night of admission for ripening of her cervix and plans  delivery the next day.  She voices understanding the risks, benefits,  and alternatives of this plan.   PAST MEDICAL HISTORY:  Insignificant.   PAST SURGICAL HISTORY:  Significant for wisdom tooth extraction.   PAST OBSTETRICAL AND GYNECOLOGICAL HISTORY:  G1 is a current pregnancy  with no abnormal Pap smears and sexually transmitted diseases.   MEDICATIONS:  Prenatal vitamins.   ALLERGIES:  No known drug allergies.   SOCIAL HISTORY:  She has a history of tobacco use and quit with the  pregnancy.  No alcohol or drug use in the pregnancy.  She is married.   FAMILY HISTORY:  Significant for heart disease in maternal grandmother,  had a stroke in maternal great grandmother with heart disease, chronic  hypertension in father, maternal grandmother, and mother.  Breast cancer  in maternal grandmother.  Father with diabetes.  Paternal grandfather,  maternal grandfather, maternal grandmother with diabetes.  Thyroid  dysfunction in maternal aunt.   PRENATAL LABORATORY DATA:  Hemoglobin 13.9, platelets 256,000.  RPR  nonreactive, rubella immune, hepatitis B surface antigen  negative, and  HIV negative.  Urine, no growth.  Pap smears are within normal limits.  She is O positive with antibody screen negative, gonorrhea negative, and  chlamydia negative.  Cystic fibrosis screen was negative.  First  trimester screen was negative.  AFP was negative.  Group B strep was  negative.  Ultrasound revealed normal anatomy, limited heart, and  limited face.  This was performed on August 01, 2008.  She had a normal  nuchal thickness and strength.  Followup ultrasound continued to have  limited anatomy revealed a female infant.  [facility], which revealed normal anatomy with a marginal  previa which with an ultrasound.  A followup ultrasound had resolved.  Her gestational diabetes screen was 207.  Her diabetes has been fairly  well controlled with 5 mg of glyburide daily.   PHYSICAL EXAMINATION:  VITAL SIGNS:  She is afebrile.  Vital signs are  stable.  GENERAL:  No apparent distress.  CARDIOVASCULAR:  Regular rate and rhythm.  LUNGS:  Clear to auscultation bilaterally.  ABDOMEN:  Soft.  Fundus is nontender.  EXTREMITIES:  Symmetric and  nontender.  PELVIC:  Fetal heart tones in the last visit were in the 150s with  occasional contractions.  Exam was closed, 80% effaced, and -2 station.   ASSESSMENT AND PLAN:  A 30 year old, G1, P0 for induction of labor,  starting with cervical ripening on the evening of the December 27, 2008.  This was discussed with the patient.  She agrees with the plan.      Sherron Monday, MD  Electronically Signed     JB/MEDQ  D:  12/25/2008  T:  12/26/2008  Job:  [id number]

## 2011-11-30 IMAGING — US US FETAL BPP W/O NONSTRESS
1 series · 14 of 16 positions shown · non-contrast
Comparison: none

[Series 1: us fetal bpp w/o nonstress · 0.32mm/px · 14 of 16 slices shown]
[im 1/16]
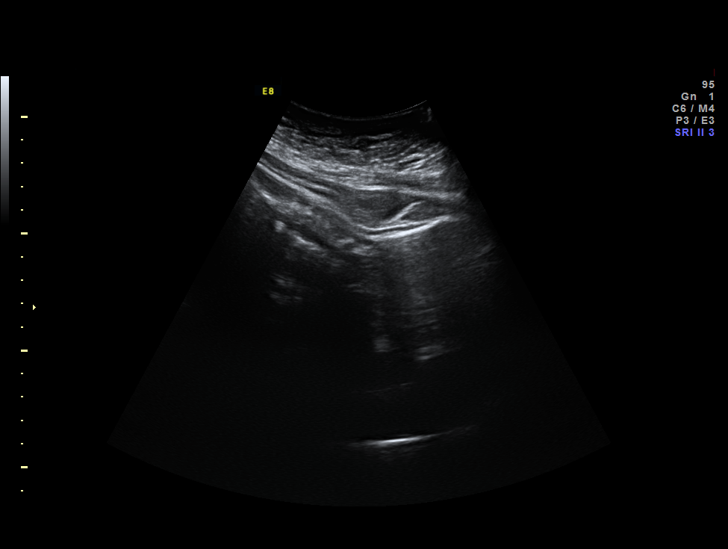
[im 2/16]
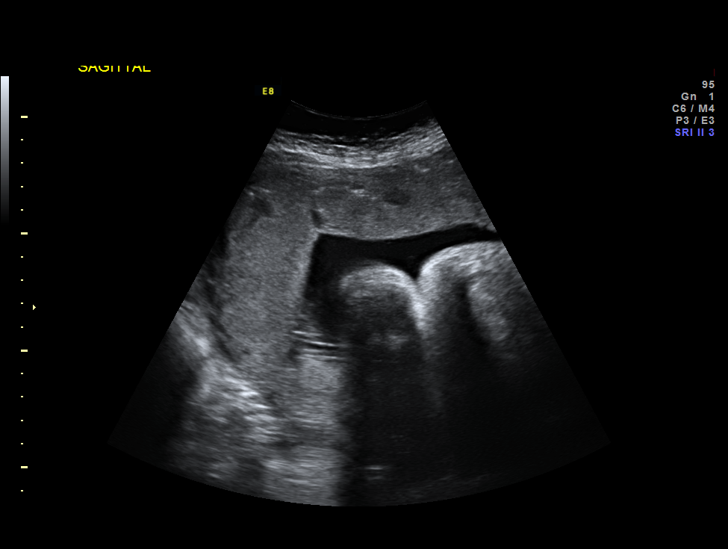
[im 3/16]
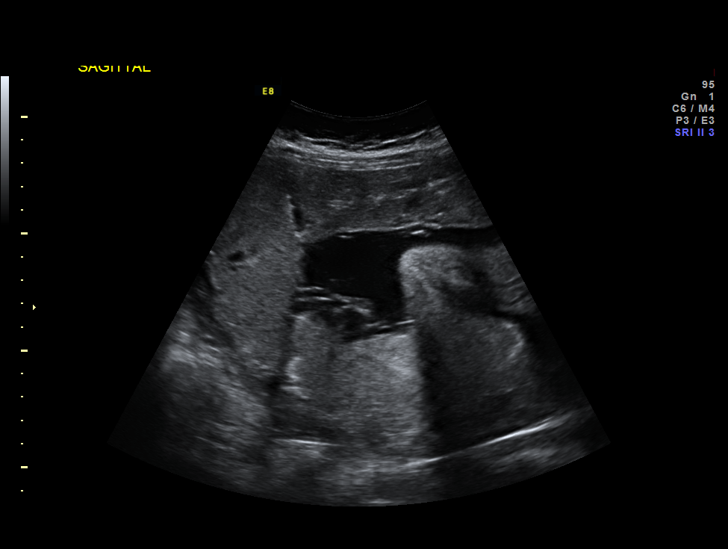
[im 5/16]
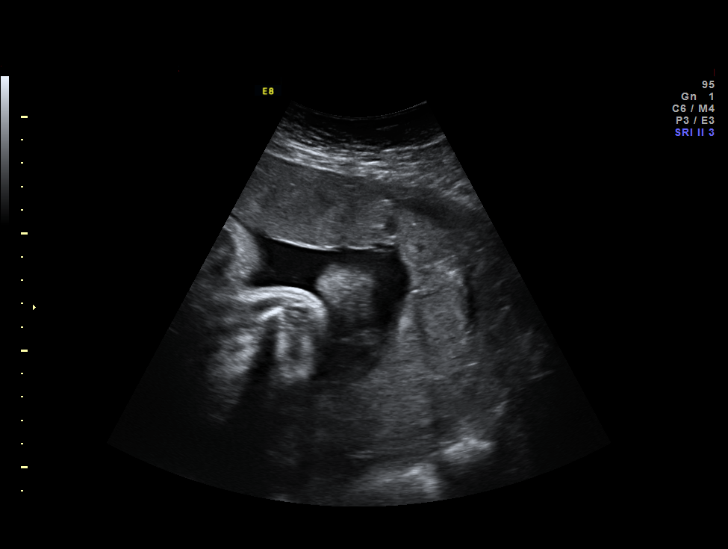
[im 6/16]
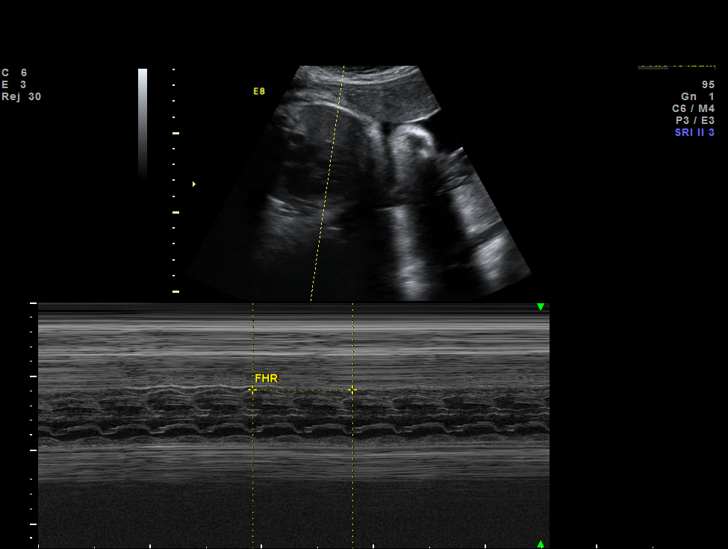
[im 7/16]
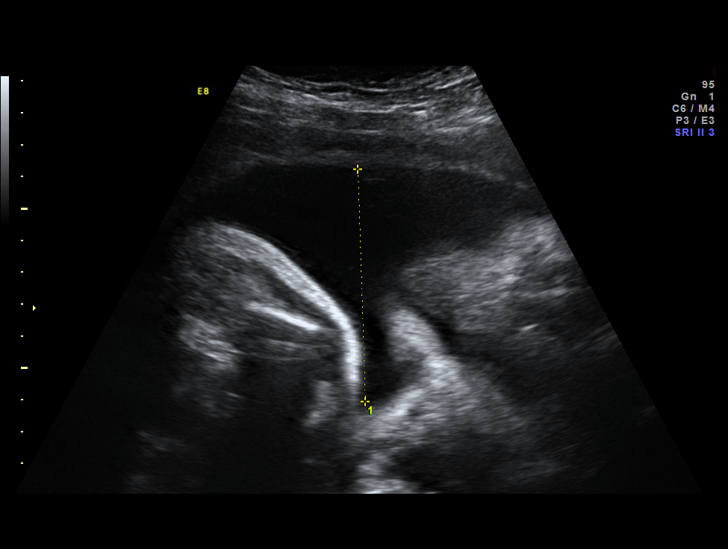
[im 8/16]
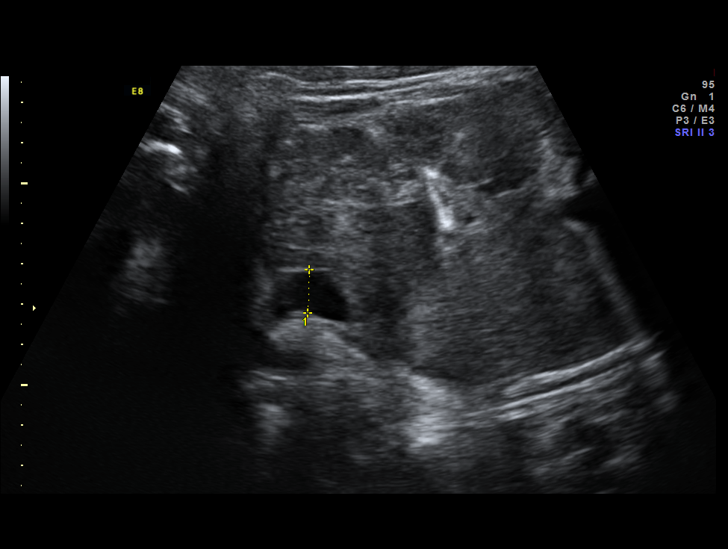
[im 9/16]
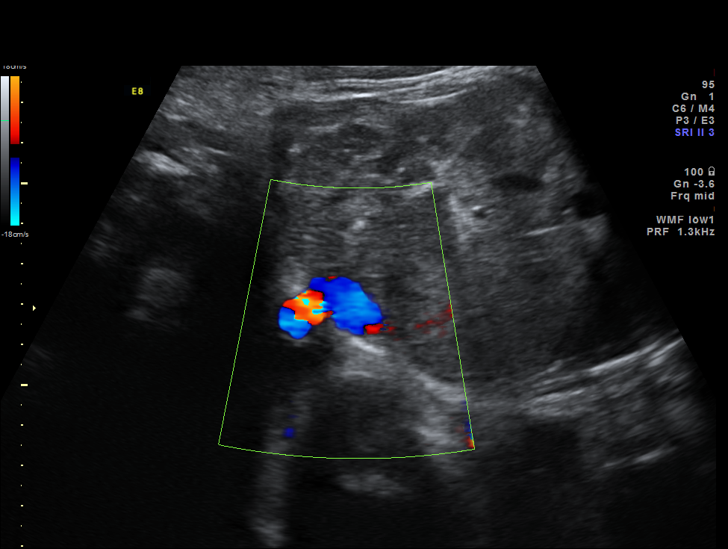
[im 10/16]
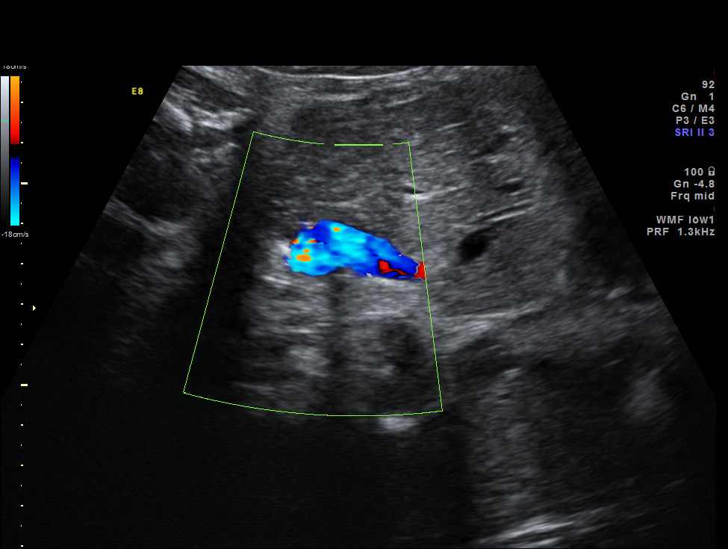
[im 11/16]
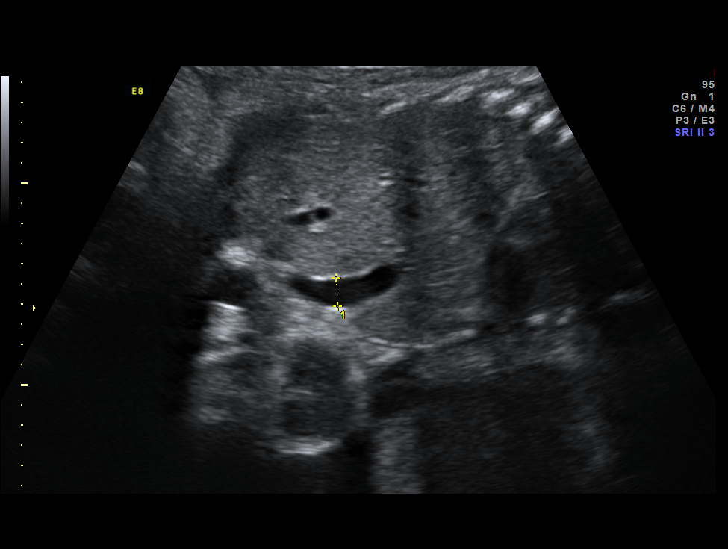
[im 13/16]
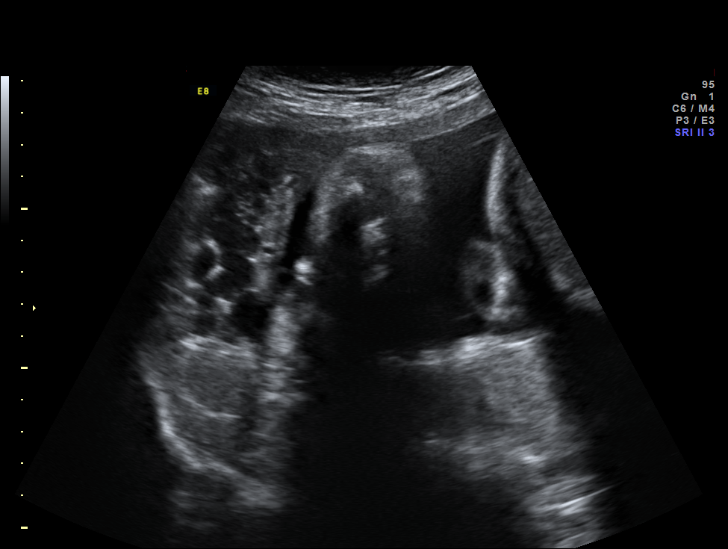
[im 14/16]
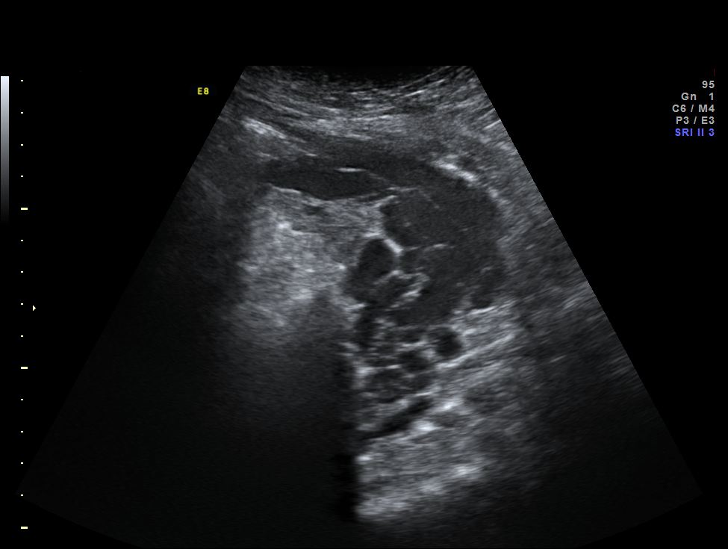
[im 15/16]
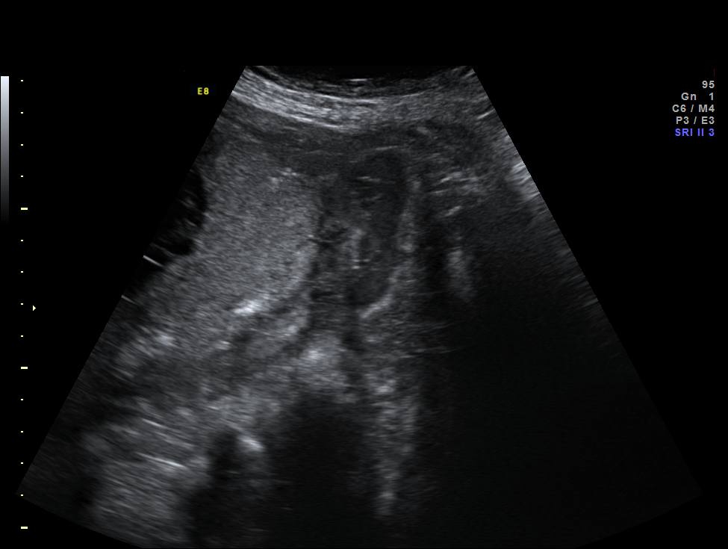
[im 16/16]
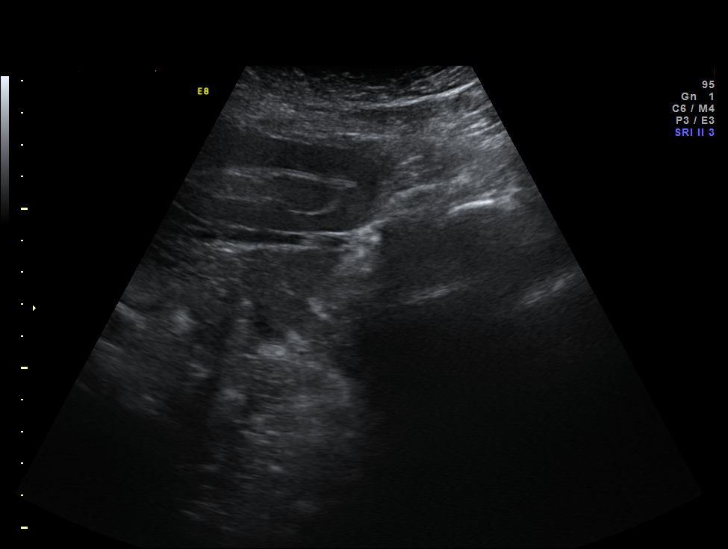

[14 of 16 positions shown; findings below may reference images not displayed]

Canned report from images found in remote index.

Refer to host system for actual result text.

## 2011-12-02 IMAGING — US US FETAL BPP W/O NONSTRESS
1 series · 14 of 17 positions shown · non-contrast
Comparison: none

[Series 1: us fetal bpp w/o nonstress · 0.22mm/px · 14 of 17 slices shown]
[im 1/17]
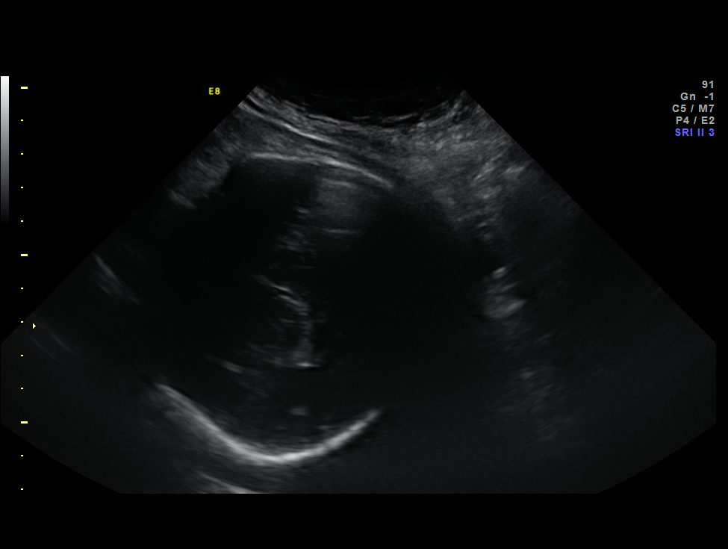
[im 2/17]
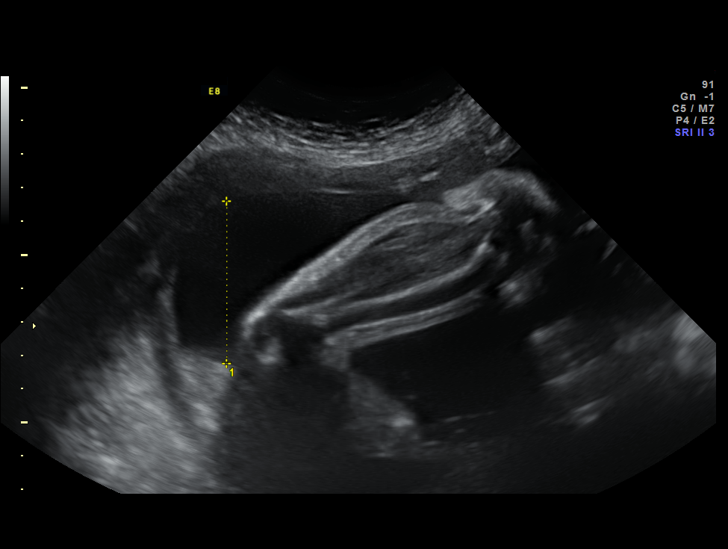
[im 4/17]
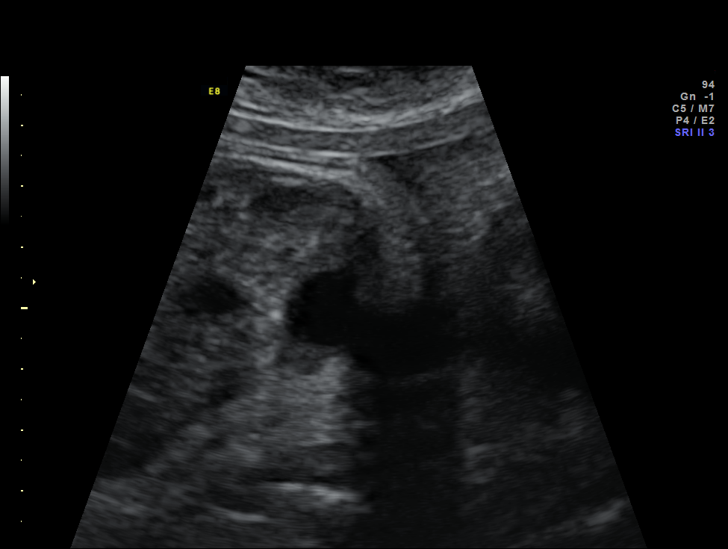
[im 5/17]
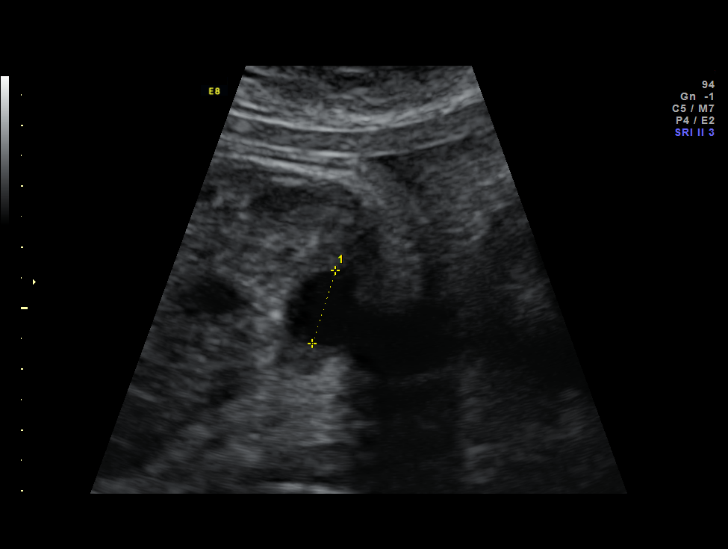
[im 6/17]
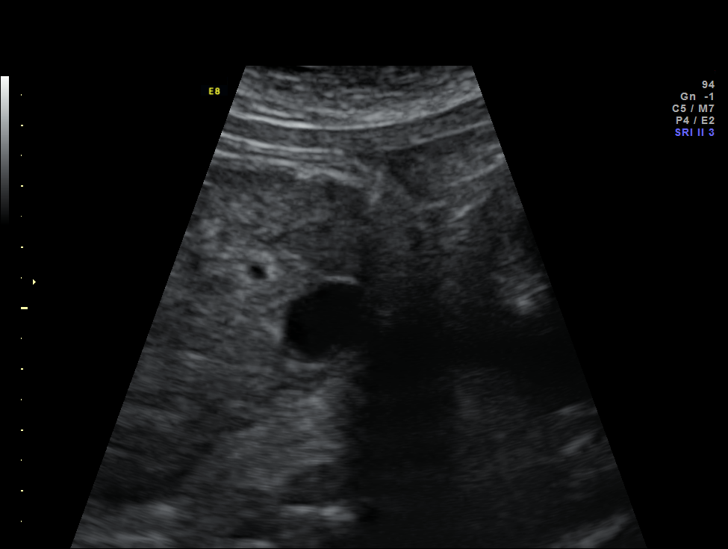
[im 7/17]
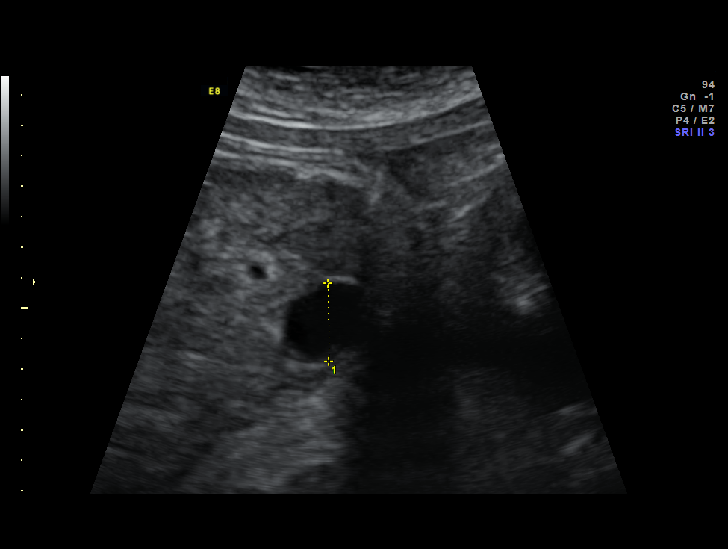
[im 8/17]
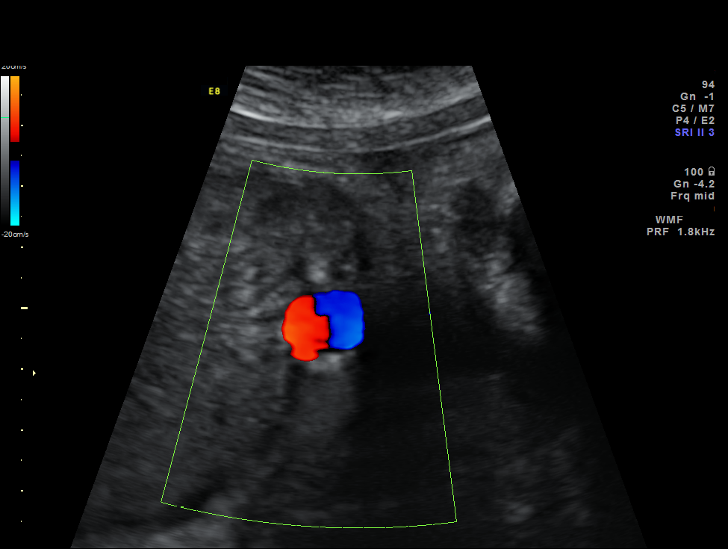
[im 10/17]
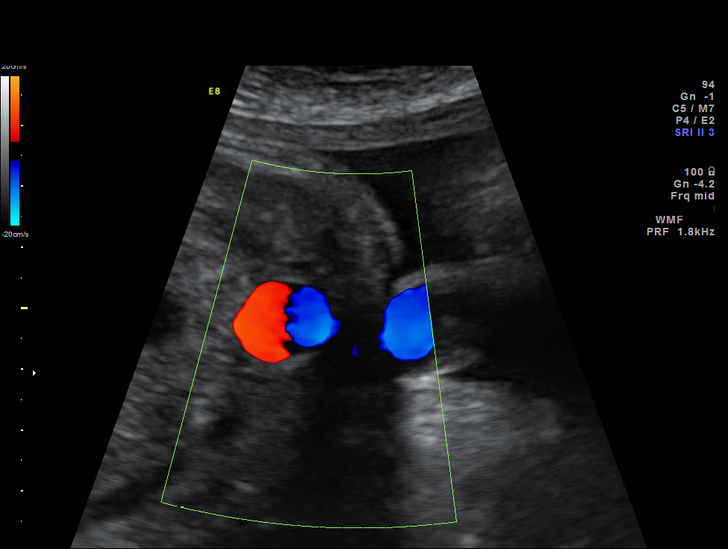
[im 11/17]
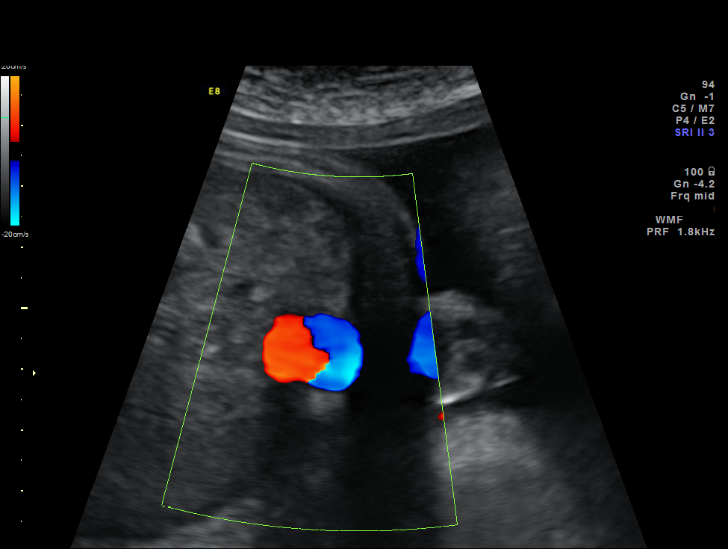
[im 12/17]
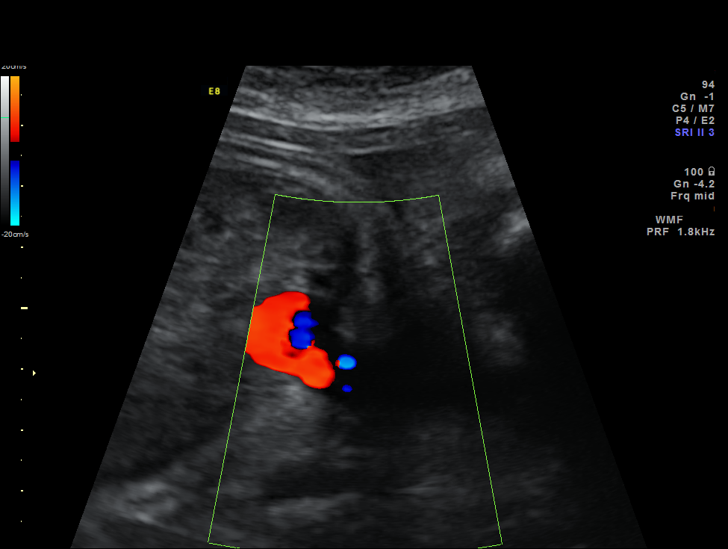
[im 13/17]
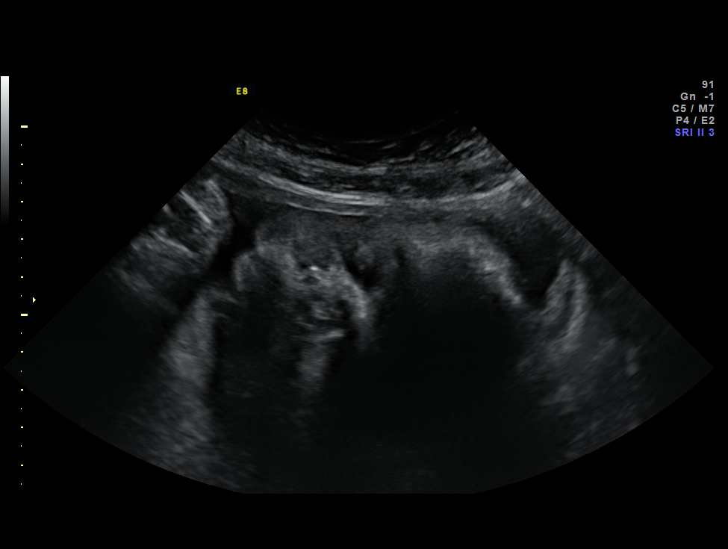
[im 14/17]
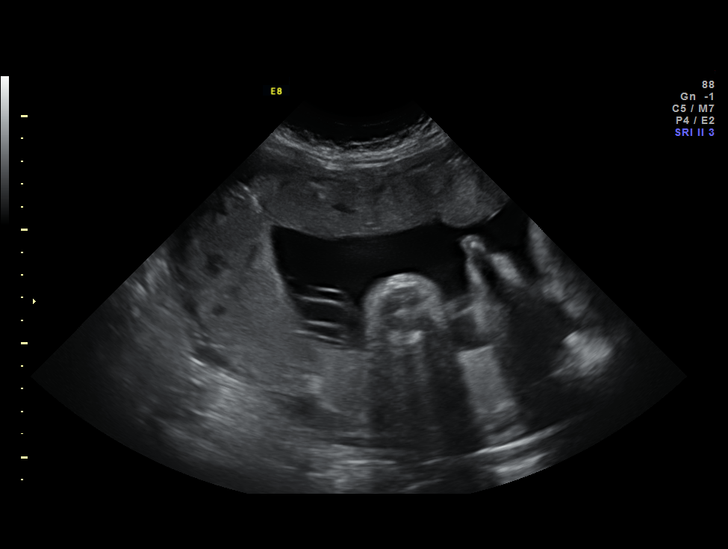
[im 16/17]
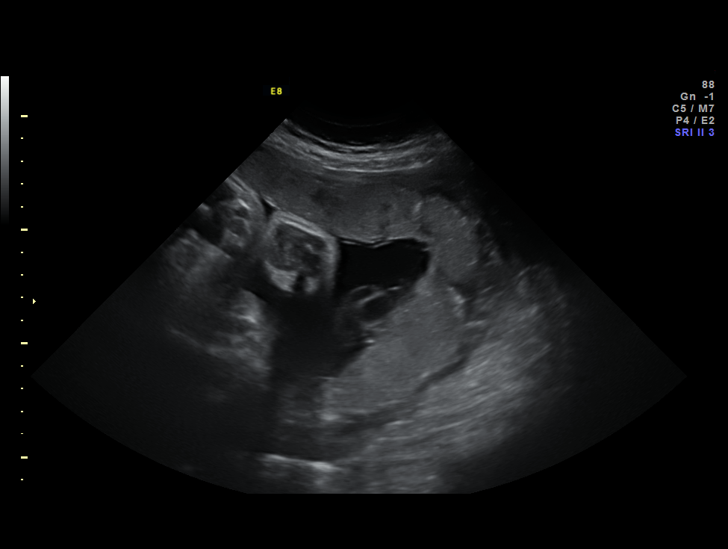
[im 17/17]
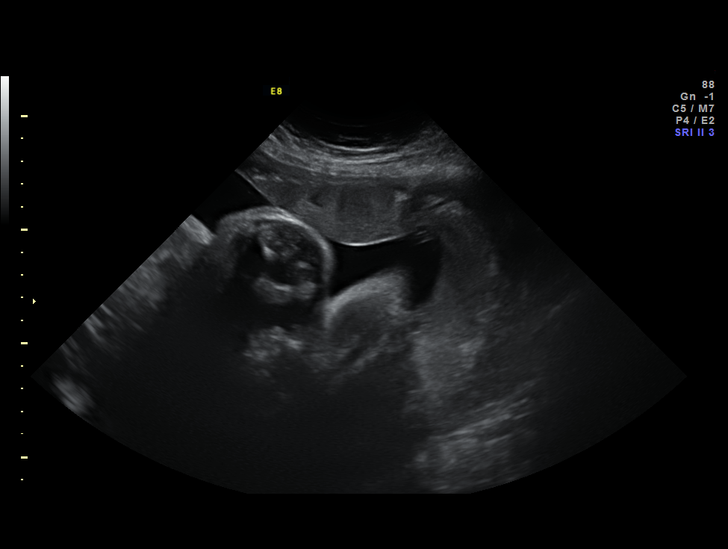

[14 of 17 positions shown; findings below may reference images not displayed]

Canned report from images found in remote index.

Refer to host system for actual result text.

## 2011-12-05 IMAGING — US US FETAL BPP W/O NONSTRESS
1 series · 14 of 22 positions shown · non-contrast
Comparison: none

[Series 1: us fetal bpp w/o nonstress · 0.23mm/px · 22 acquisitions, 14 frames shown]
[im 1/22]
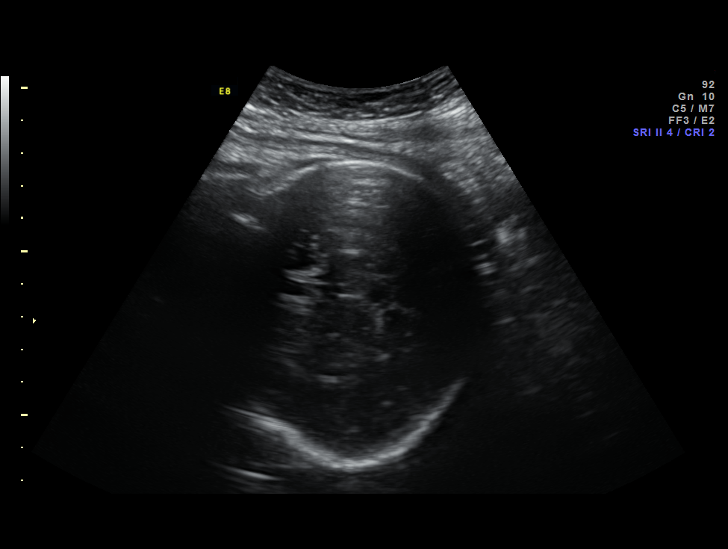
[im 3/22]
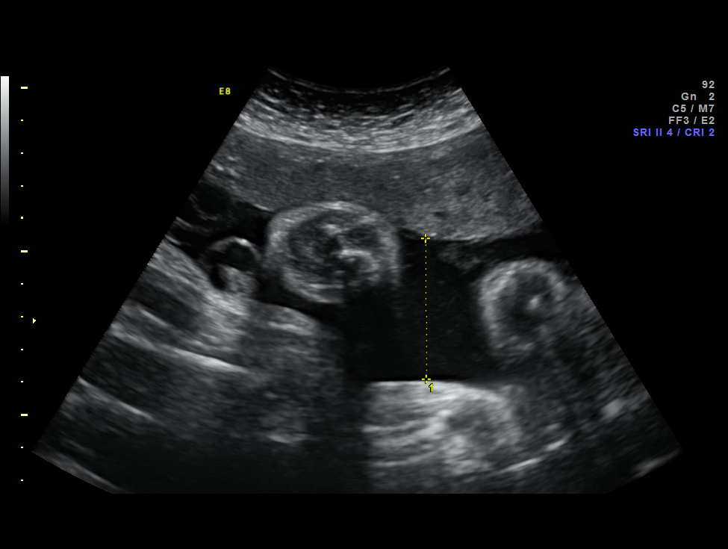
[im 4/22]
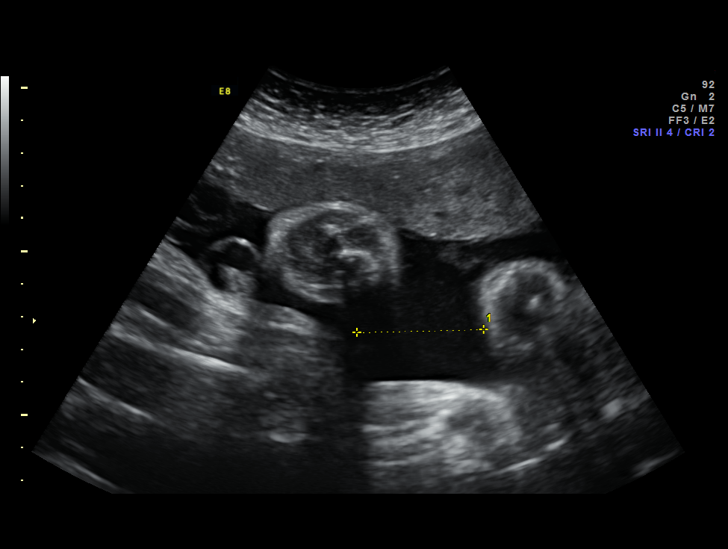
[im 6/22]
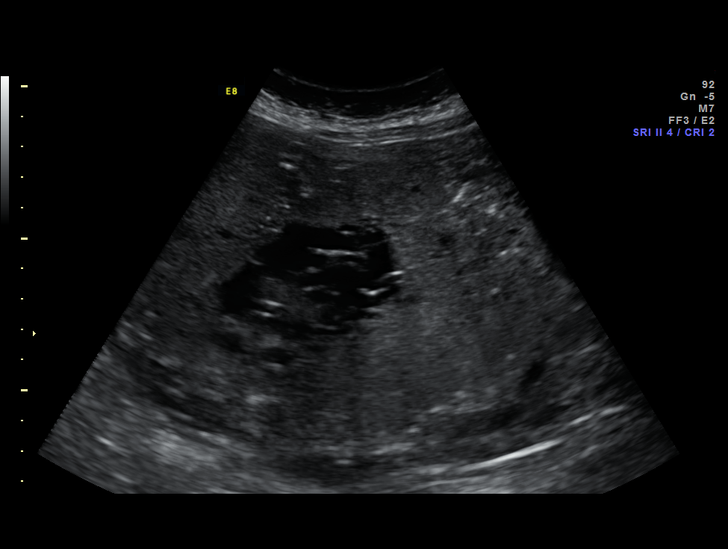
[im 8/22]
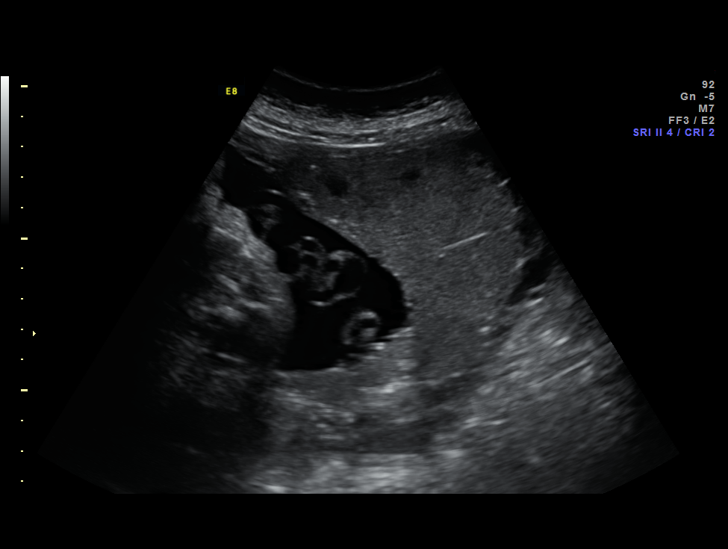
[im 9/22]
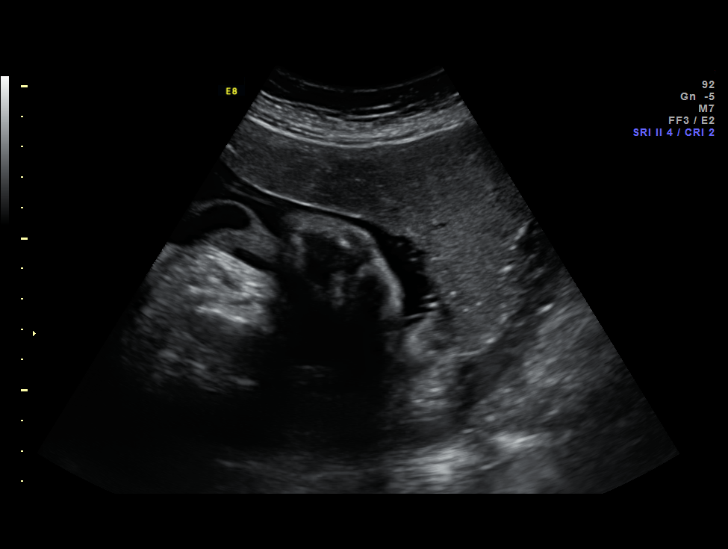
[im 11/22]
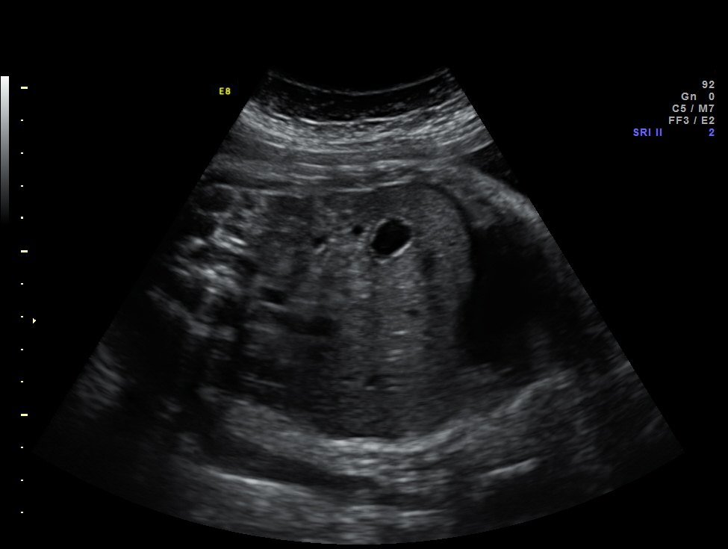
[im 12/22]
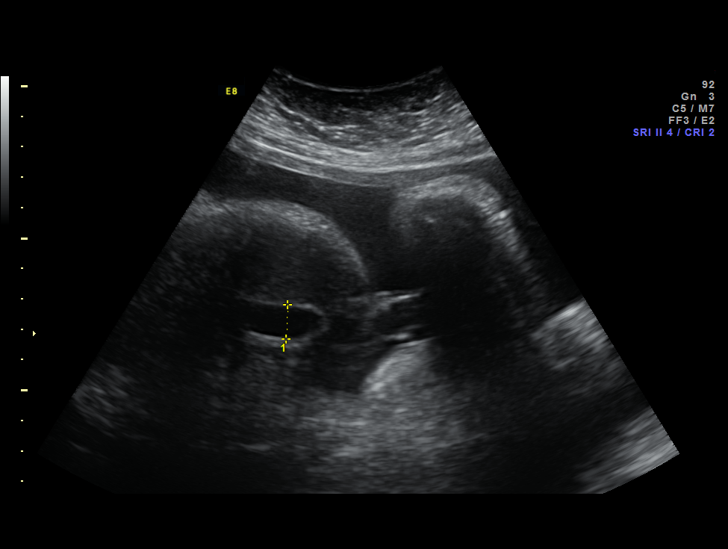
[im 14/22]
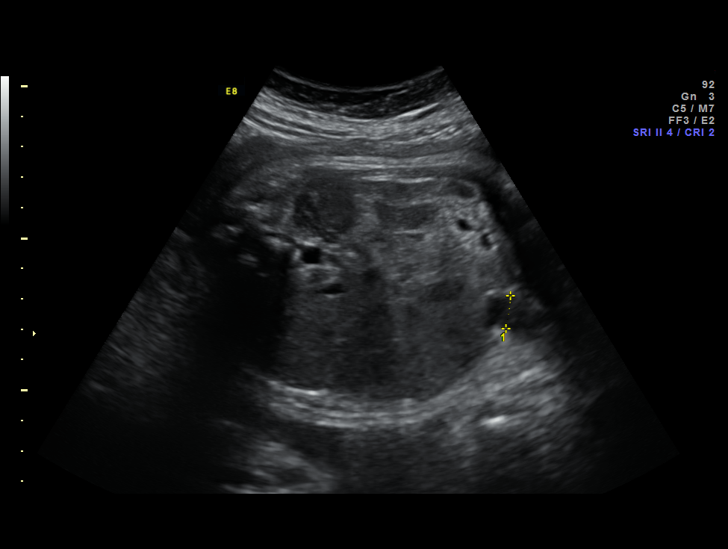
[im 15/22]
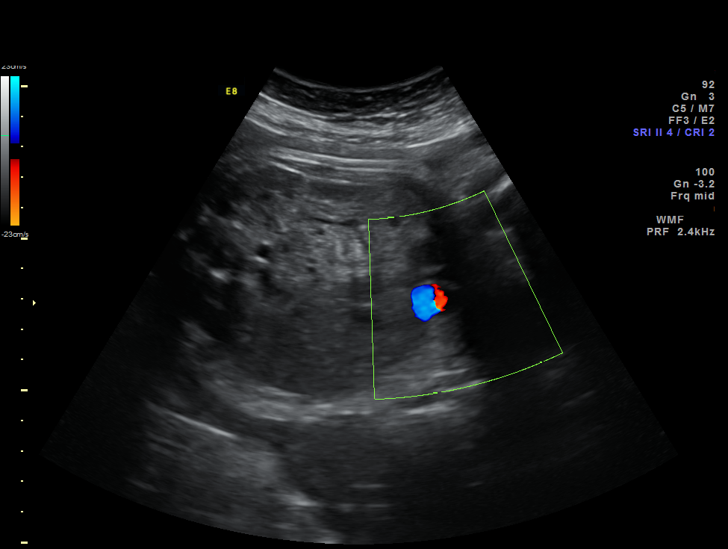
[im 17/22]
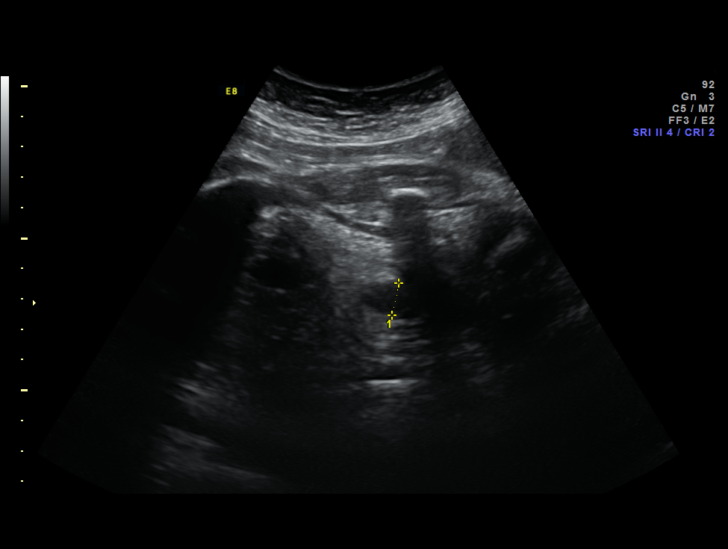
[im 19/22]
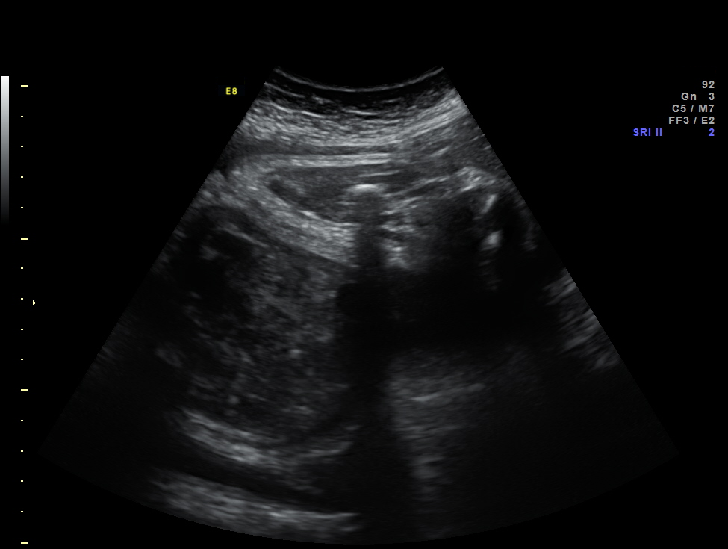
[im 20/22]
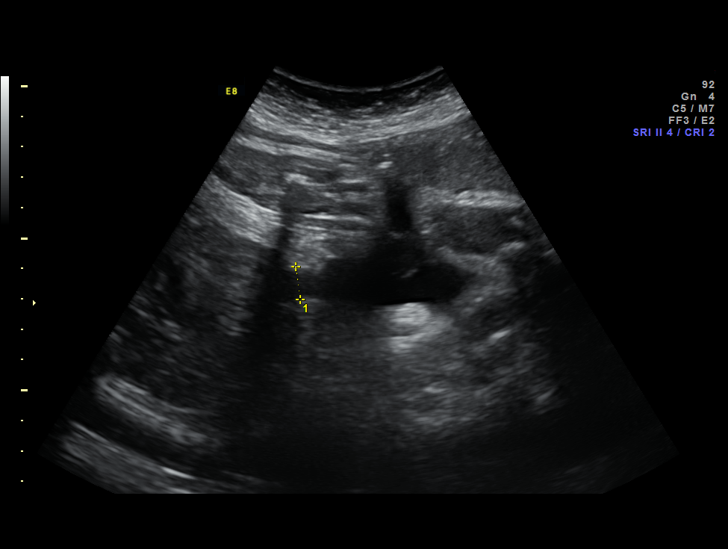
[im 22/22]
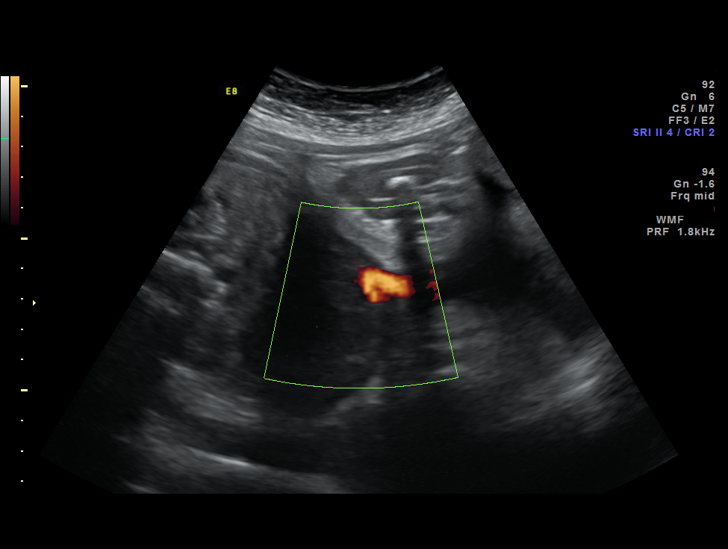

[14 of 22 positions shown; findings below may reference images not displayed]

Canned report from images found in remote index.

Refer to host system for actual result text.

## 2011-12-12 IMAGING — US US OB LIMITED
1 series · 14 of 21 positions shown · non-contrast
Comparison: none

[Series 1: us ob limited · 21 acquisitions, 14 frames shown]
[im 1/21]
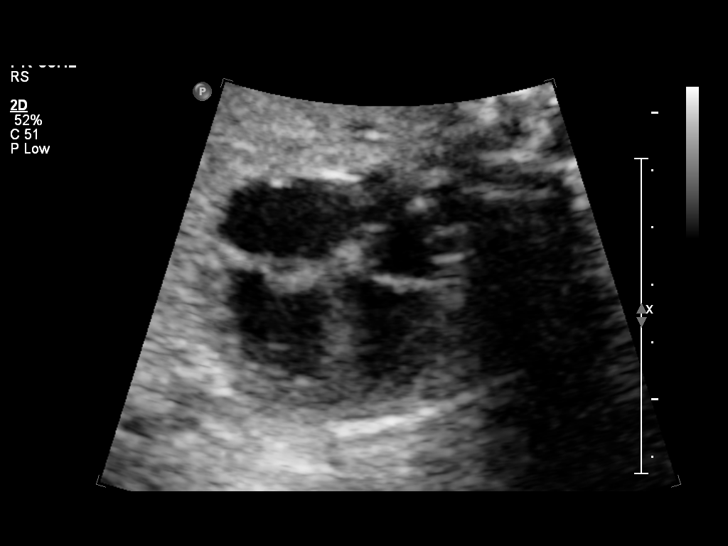
[im 3/21]
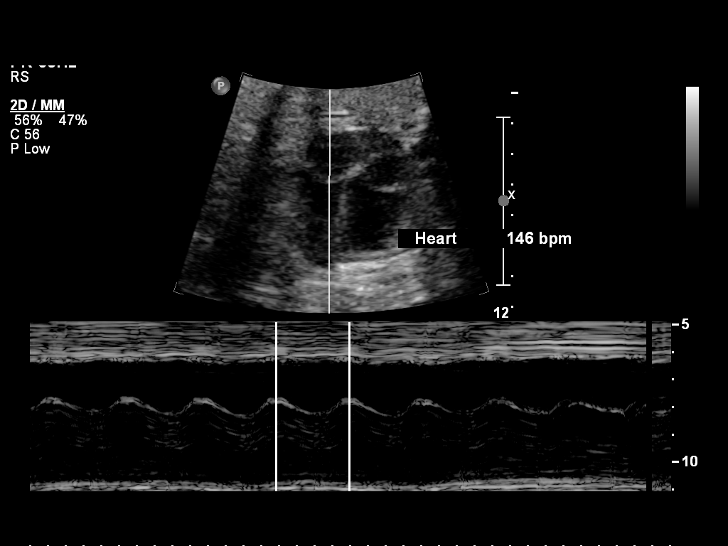
[im 4/21]
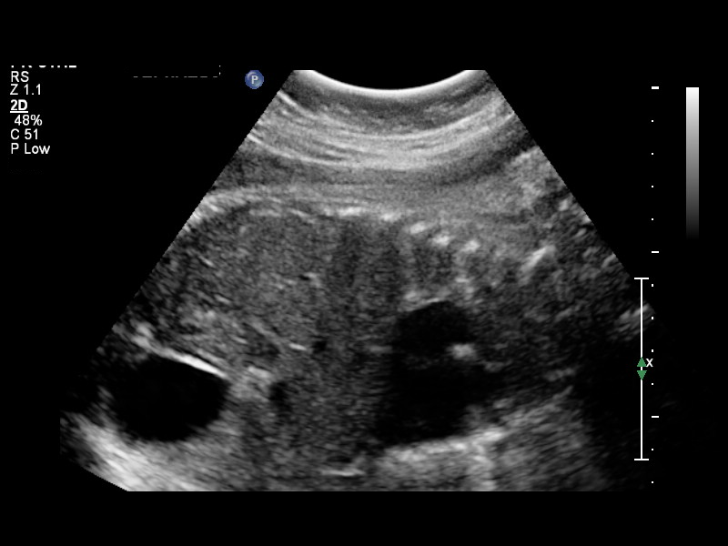
[im 6/21]
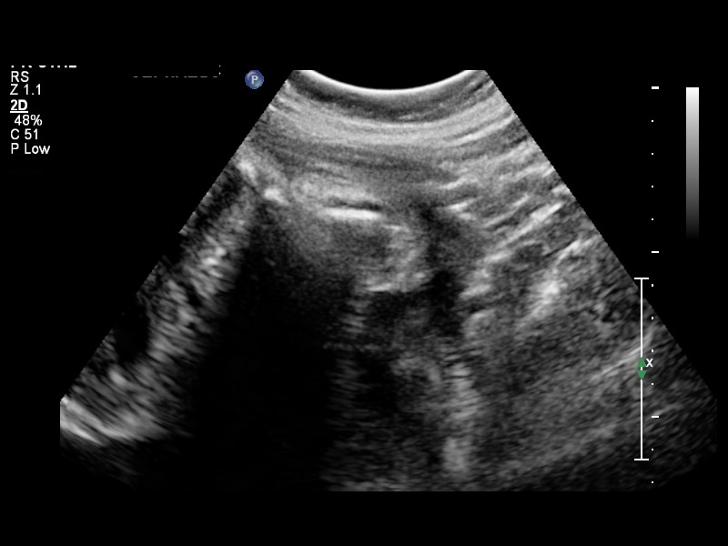
[im 7/21]
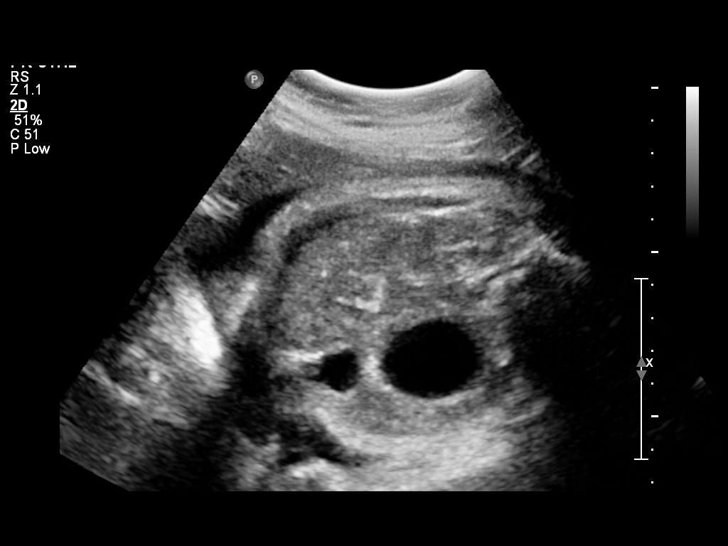
[im 9/21]
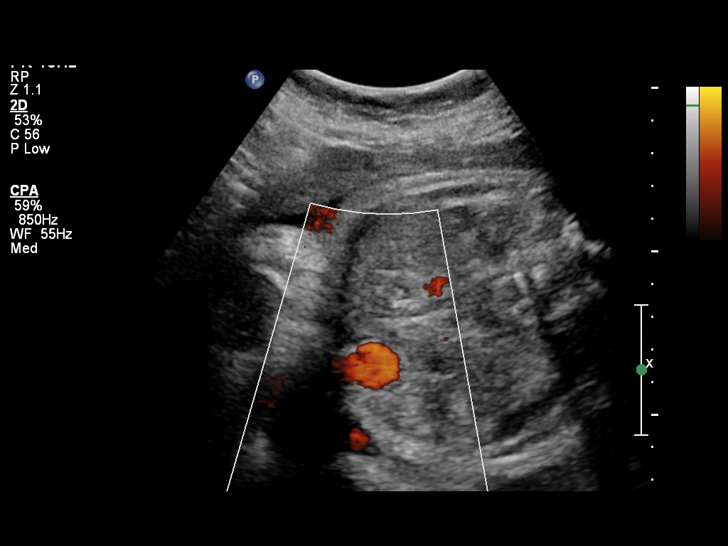
[im 10/21]
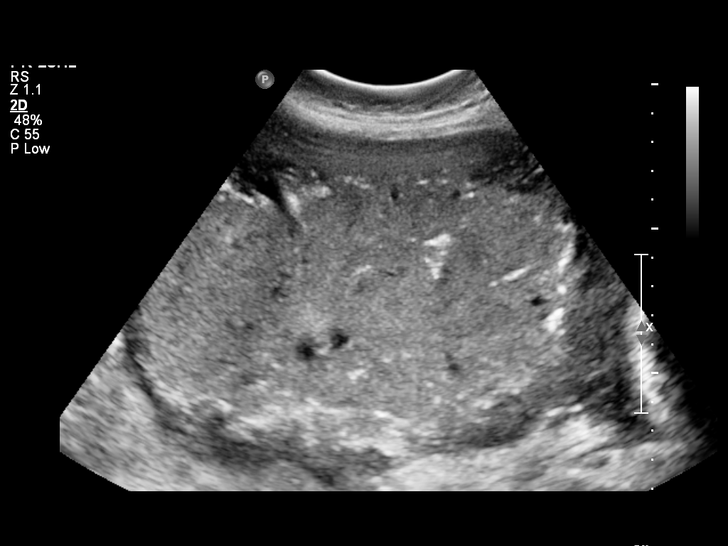
[im 12/21]
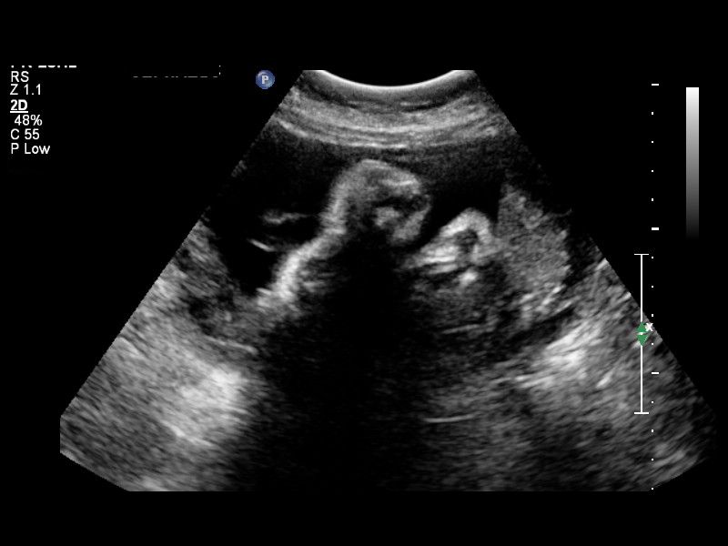
[im 13/21]
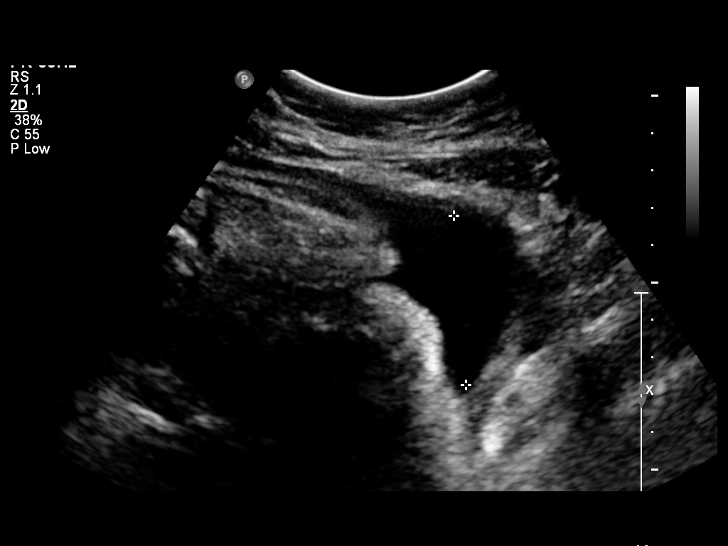
[im 15/21]
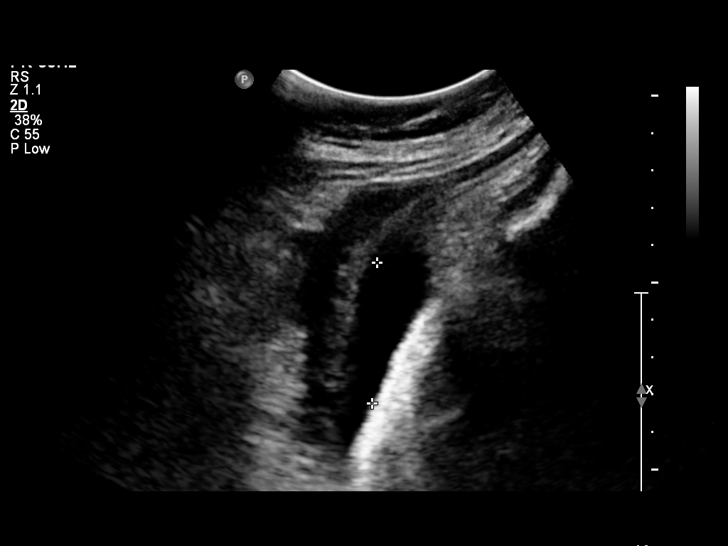
[im 16/21]
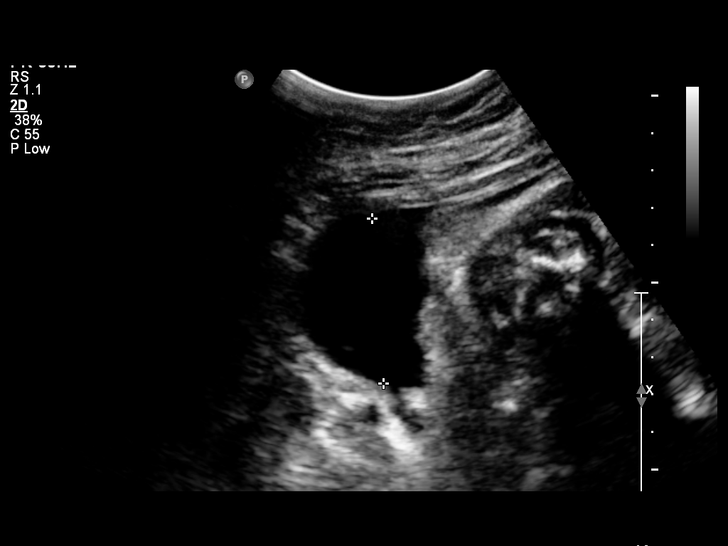
[im 18/21]
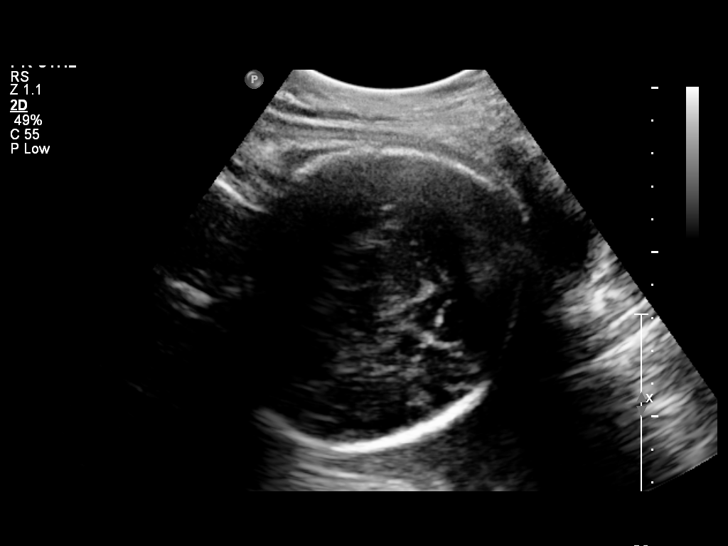
[im 19/21]
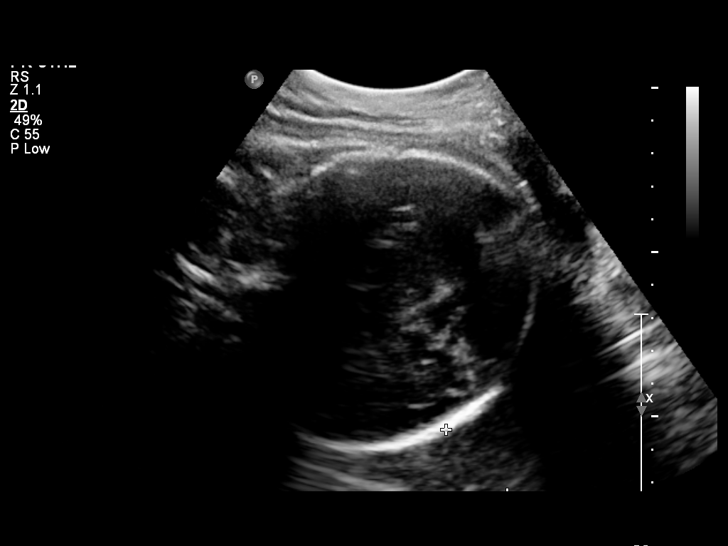
[im 21/21]
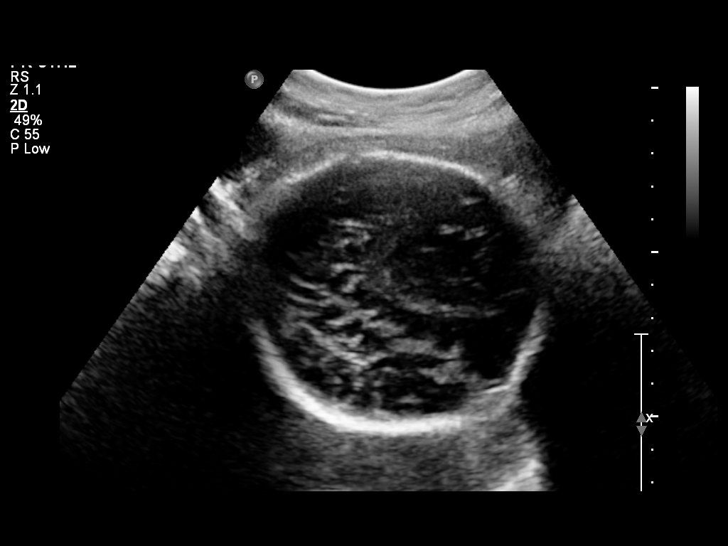

[14 of 21 positions shown; findings below may reference images not displayed]

Canned report from images found in remote index.

Refer to host system for actual result text.

## 2012-08-31 ENCOUNTER — Encounter (HOSPITAL_COMMUNITY): Payer: Self-pay | Admitting: Emergency Medicine

## 2012-08-31 ENCOUNTER — Emergency Department (HOSPITAL_COMMUNITY)
Admission: EM | Admit: 2012-08-31 | Discharge: 2012-08-31 | Disposition: A | Discharge status: Home / Self Care | Payer: 59 | Source: Home / Self Care | Attending: Family Medicine | Admitting: Family Medicine

## 2012-08-31 DIAGNOSIS — S335XXA Sprain of ligaments of lumbar spine, initial encounter: Secondary | ICD-10-CM

## 2012-08-31 DIAGNOSIS — S39012A Strain of muscle, fascia and tendon of lower back, initial encounter: Secondary | ICD-10-CM

## 2012-08-31 HISTORY — DX: Other specified behavioral and emotional disorders with onset usually occurring in childhood and adolescence: F98.8

## 2012-08-31 HISTORY — DX: Anxiety disorder, unspecified: F41.9

## 2012-08-31 LAB — POCT URINALYSIS DIP (DEVICE)
Bilirubin Urine: NEGATIVE
Ketones, ur: NEGATIVE mg/dL
Specific Gravity, Urine: 1.015 (ref 1.005–1.030)
pH: 7 (ref 5.0–8.0)

## 2012-08-31 MED ORDER — CYCLOBENZAPRINE HCL 5 MG PO TABS: 5.0000 mg | ORAL_TABLET | Freq: Three times a day (TID) | ORAL | Status: DC | PRN

## 2012-08-31 MED ORDER — DICLOFENAC POTASSIUM 50 MG PO TABS: 50.0000 mg | ORAL_TABLET | Freq: Three times a day (TID) | ORAL | Status: DC

## 2012-08-31 NOTE — ED Provider Notes (Addendum)
History     CSN: [id number]  Arrival date & time 08/31/12  1303   First MD Initiated Contact with Patient 08/31/12 1314      Chief Complaint  Patient presents with  . Back Pain    (Consider location/radiation/quality/duration/timing/severity/associated sxs/prior treatment) Patient is a 31 y.o. female presenting with back pain.  Back Pain  This is a new problem. The current episode started 2 days ago. The problem has not changed since onset.The pain is associated with no known injury (sitting at kitchen table when back began hurting.). The pain is present in the lumbar spine, sacro-iliac joint and gluteal region. The quality of the pain is described as stabbing. The pain does not radiate. The pain is mild. The symptoms are aggravated by bending and certain positions. Pertinent negatives include no abdominal pain, no bowel incontinence, no bladder incontinence, no pelvic pain, no leg pain, no paresthesias, no tingling and no weakness. Risk factors include obesity and lack of exercise.    Past Medical History  Diagnosis Date  . Anxiety   . ADD (attention deficit disorder)     Past Surgical History  Procedure Date  . Cesarean section     No family history on file.  History  Substance Use Topics  . Smoking status: Never Smoker   . Smokeless tobacco: Not on file  . Alcohol Use: No    OB History    Grav Para Term Preterm Abortions TAB SAB Ect Mult Living   1               Review of Systems  Constitutional: Negative.   Gastrointestinal: Negative.  Negative for abdominal pain and bowel incontinence.  Genitourinary: Negative.  Negative for bladder incontinence and pelvic pain.  Musculoskeletal: Positive for back pain. Negative for joint swelling and gait problem.  Neurological: Negative for tingling, weakness and paresthesias.    Allergies  Review of patient's allergies indicates no known allergies.  Home Medications   Current Outpatient Rx  Name  Route  Sig   Dispense  Refill  . AMPHETAMINE-DEXTROAMPHETAMINE 30 MG PO TABS   Oral   Take 30 mg by mouth daily.         Marland Kitchen LEVONORGESTREL 20 MCG/24HR IU IUD   Intrauterine   1 each by Intrauterine route once.         . CYCLOBENZAPRINE HCL 5 MG PO TABS   Oral   Take 1 tablet (5 mg total) by mouth 3 (three) times daily as needed for muscle spasms.   30 tablet   0   . DICLOFENAC POTASSIUM 50 MG PO TABS   Oral   Take 1 tablet (50 mg total) by mouth 3 (three) times daily. For back pain   30 tablet   0     BP 118/79  Pulse 92  Temp 97.6 F (36.4 C) (Oral)  Resp 16  SpO2 100%  LMP 08/24/2012  Breastfeeding? Unknown  Physical Exam  Nursing note and vitals reviewed. Constitutional: She is oriented to person, place, and time. She appears well-developed and well-nourished.  Abdominal: Soft. Bowel sounds are normal. There is no tenderness.  Musculoskeletal: She exhibits tenderness.       Lumbar back: She exhibits decreased range of motion, tenderness, pain and spasm. She exhibits no bony tenderness, no swelling, no deformity and normal pulse.       Back:  Neurological: She is alert and oriented to person, place, and time.  Skin: Skin is warm and dry.  ED Course  Procedures (including critical care time)  Labs Reviewed  POCT URINALYSIS DIP (DEVICE) - Abnormal; Notable for the following:    Hgb urine dipstick SMALL (*)     All other components within normal limits  POCT PREGNANCY, URINE   No results found.   1. Strain of lumbar paraspinal muscle       MDM          Linna Hoff, MD 08/31/12 1351  Linna Hoff, MD 08/31/12 1655

## 2012-08-31 NOTE — ED Notes (Addendum)
Pt c/o lower back pain x3 days... Denies any inj/trauma to site, fevers, vomiting, nauseas, diarrhea, hematuria, vag discharge... Pain is constant and progressively getting worse... Activity will also make the pain worse... Pt is alert w/no signs of acute distress.

## 2013-10-13 ENCOUNTER — Telehealth: Payer: Self-pay | Admitting: Nurse Practitioner

## 2013-10-21 NOTE — Telephone Encounter (Signed)
Detailed message left that if patient still needs to be seen to please call us back

## 2014-08-07 ENCOUNTER — Encounter (HOSPITAL_COMMUNITY): Payer: Self-pay | Admitting: Emergency Medicine

## 2014-11-07 ENCOUNTER — Ambulatory Visit (INDEPENDENT_AMBULATORY_CARE_PROVIDER_SITE_OTHER): Payer: BLUE CROSS/BLUE SHIELD | Admitting: Family Medicine

## 2014-11-07 ENCOUNTER — Ambulatory Visit: Payer: Self-pay | Admitting: Family Medicine

## 2014-11-07 ENCOUNTER — Encounter (INDEPENDENT_AMBULATORY_CARE_PROVIDER_SITE_OTHER): Payer: Self-pay

## 2014-11-07 VITALS — BP 119/72 | HR 97 | Temp 97.2°F | Ht 63.0 in | Wt 199.0 lb

## 2014-11-07 DIAGNOSIS — J029 Acute pharyngitis, unspecified: Secondary | ICD-10-CM

## 2014-11-07 MED ORDER — HYDROCODONE-ACETAMINOPHEN 5-325 MG PO TABS: 1.0000 | ORAL_TABLET | Freq: Four times a day (QID) | ORAL | Status: DC | PRN

## 2014-11-07 MED ORDER — FLUCONAZOLE 150 MG PO TABS: 150.0000 mg | ORAL_TABLET | Freq: Once | ORAL | Status: DC

## 2014-11-07 MED ORDER — PENICILLIN V POTASSIUM 500 MG PO TABS: 500.0000 mg | ORAL_TABLET | Freq: Four times a day (QID) | ORAL | Status: DC

## 2014-11-07 MED ORDER — CEFTRIAXONE SODIUM 1 G IJ SOLR
1.0000 g | Freq: Once | INTRAMUSCULAR | Status: AC
  Administered 2014-11-07: 1 g via INTRAMUSCULAR

## 2014-11-07 NOTE — Progress Notes (Signed)
   Subjective:    Patient ID: Lindsay [name], female    DOB: Feb 04, 1981, 34 y.o.   MRN: [id number]  HPI Patient is here for C/o severe pharyngitis and uri sx's.  [facility] and [facility]'d PCN VK 500mg  bid x 10 days and she has taken a week of this and is not better.    Review of Systems  Constitutional: Negative for fever.  HENT: Negative for ear pain.   Eyes: Negative for discharge.  Respiratory: Negative for cough.   Cardiovascular: Negative for chest pain.  Gastrointestinal: Negative for abdominal distention.  Endocrine: Negative for polyuria.  Genitourinary: Negative for difficulty urinating.  Musculoskeletal: Negative for gait problem and neck pain.  Skin: Negative for color change and rash.  Neurological: Negative for speech difficulty and headaches.  Psychiatric/Behavioral: Negative for agitation.       Objective:    BP 119/72 mmHg  Pulse 97  Temp(Src) 97.2 F (36.2 C) (Oral)  Ht 5\' 3"  (1.6 m)  Wt 199 lb (90.266 kg)  BMI 35.26 kg/m2 Physical Exam  Constitutional: She is oriented to person, place, and time. She appears well-developed and well-nourished.  HENT:  Head: Normocephalic and atraumatic.  Mouth/Throat: Oropharyngeal exudate present.  Eyes: Pupils are equal, round, and reactive to light.  Neck: Normal range of motion. Neck supple.  Cardiovascular: Normal rate and regular rhythm.   No murmur heard. Pulmonary/Chest: Effort normal and breath sounds normal.  Abdominal: Soft. Bowel sounds are normal. There is no tenderness.  Neurological: She is alert and oriented to person, place, and time.  Skin: Skin is warm and dry.  Psychiatric: She has a normal mood and affect.          Assessment & Plan:     ICD-9-CM ICD-10-CM   1. Acute pharyngitis, unspecified pharyngitis type 462 J02.9 penicillin v potassium (VEETID) 500 MG tablet     HYDROcodone-acetaminophen (NORCO) 5-325 MG per tablet     cefTRIAXone (ROCEPHIN)  injection 1 g   WSWG's prn  Push po fluids, rest, tylenol and motrin otc prn as directed for fever, arthralgias, and myalgias.  Follow up prn if sx's continue or persist.  Return if symptoms worsen or fail to improve.  Lysbeth Penner FNP

## 2014-11-09 ENCOUNTER — Encounter: Payer: Self-pay | Admitting: *Deleted

## 2015-06-14 ENCOUNTER — Ambulatory Visit (INDEPENDENT_AMBULATORY_CARE_PROVIDER_SITE_OTHER): Payer: PRIVATE HEALTH INSURANCE | Admitting: Physician Assistant

## 2015-06-14 ENCOUNTER — Encounter: Payer: Self-pay | Admitting: Physician Assistant

## 2015-06-14 VITALS — BP 117/73 | HR 113 | Temp 101.3°F | Ht 63.0 in | Wt 196.0 lb

## 2015-06-14 DIAGNOSIS — R509 Fever, unspecified: Secondary | ICD-10-CM | POA: Diagnosis not present

## 2015-06-14 DIAGNOSIS — J029 Acute pharyngitis, unspecified: Secondary | ICD-10-CM | POA: Diagnosis not present

## 2015-06-14 LAB — POCT RAPID STREP A (OFFICE): Rapid Strep A Screen: NEGATIVE

## 2015-06-14 LAB — POCT INFLUENZA A/B
INFLUENZA B, POC: NEGATIVE
Influenza A, POC: NEGATIVE

## 2015-06-14 MED ORDER — DOXYCYCLINE HYCLATE 100 MG PO TABS: 100.0000 mg | ORAL_TABLET | Freq: Two times a day (BID) | ORAL | Status: DC

## 2015-06-14 NOTE — Progress Notes (Signed)
   Subjective:    Patient ID: Lindsay [name], female    DOB: [birth year], 34 y.o.   MRN: [id number]  HPI 34 y/o female presents with sore throat, fever x 3 days. Has been taking Motrin k800 mg for fever and pain with mild relief. Her 2 daughters were sick last week with similar symptoms but only lasted 24 hours. 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     Review of Systems  Constitutional: Positive for fever, chills, diaphoresis, appetite change and fatigue.  HENT: Positive for sore throat. Negative for congestion.   Respiratory: Negative.   Cardiovascular: Negative.   Gastrointestinal: Negative.   Genitourinary: Negative.   Musculoskeletal: Positive for myalgias.  Psychiatric/Behavioral: Negative.   All other systems reviewed and are negative.      Objective:   Physical Exam  Constitutional: She is oriented to person, place, and time. She appears well-developed and well-nourished.  Febrile   HENT:  Right Ear: External ear normal.  Left Ear: External ear normal.  Mouth/Throat: Oropharynx is clear and moist. No oropharyngeal exudate.  Neck: Normal range of motion.  Cardiovascular: Regular rhythm and normal heart sounds.  Exam reveals no gallop and no friction rub.   No murmur heard. Tachycardic   Pulmonary/Chest: Effort normal and breath sounds normal. No respiratory distress. She has no wheezes. She has no rales. She exhibits no tenderness.  Lymphadenopathy:    She has no cervical adenopathy.  Neurological: She is alert and oriented to person, place, and time.  Skin: She is not diaphoretic.  Psychiatric: She has a normal mood and affect. Her behavior is normal. Judgment and thought content normal.  Vitals reviewed.         Assessment & Plan:  1. Sore throat  - POCT rapid strep A - Upper Respiratory Culture, Routine - CBC with Differential/Platelet  2. Fever in adult  - POCT Influenza A/B - BMP8+EGFR - Lyme Ab/Western Blot Reflex - Rocky mtn  spotted fvr abs pnl(IgG+IgM) - CBC with Differential/Platelet - doxycycline (VIBRA-TABS) 100 MG tablet; Take 1 tablet (100 mg total) by mouth 2 (two) times daily.  Dispense: 28 tablet; Refill: 0   Tylenol or ibuprofen for fever  Nikoletta Varma A. Benjamin Stain PA-C

## 2015-06-16 LAB — BMP8+EGFR
BUN/Creatinine Ratio: 12 (ref 8–20)
BUN: 8 mg/dL (ref 6–20)
CALCIUM: 9.3 mg/dL (ref 8.7–10.2)
CHLORIDE: 98 mmol/L (ref 97–108)
CO2: 20 mmol/L (ref 18–29)
Creatinine, Ser: 0.68 mg/dL (ref 0.57–1.00)
GFR calc non Af Amer: 114 mL/min/{1.73_m2} (ref >59)
GFR, EST AFRICAN AMERICAN: 132 mL/min/{1.73_m2} (ref >59)
Glucose: 141 mg/dL — ABNORMAL HIGH (ref 65–99)
POTASSIUM: 3.6 mmol/L (ref 3.5–5.2)
Sodium: 138 mmol/L (ref 134–144)

## 2015-06-16 LAB — CBC WITH DIFFERENTIAL/PLATELET
BASOS: 0 %
Basophils Absolute: 0 10*3/uL (ref 0.0–0.2)
EOS (ABSOLUTE): 0 10*3/uL (ref 0.0–0.4)
EOS: 0 %
HEMATOCRIT: 41 % (ref 34.0–46.6)
HEMOGLOBIN: 14.2 g/dL (ref 11.1–15.9)
IMMATURE GRANS (ABS): 0 10*3/uL (ref 0.0–0.1)
Immature Granulocytes: 0 %
LYMPHS: 7 %
Lymphocytes Absolute: 1.3 10*3/uL (ref 0.7–3.1)
MCH: 30.8 pg (ref 26.6–33.0)
MCHC: 34.6 g/dL (ref 31.5–35.7)
MCV: 89 fL (ref 79–97)
MONOCYTES: 9 %
Monocytes Absolute: 1.6 10*3/uL — ABNORMAL HIGH (ref 0.1–0.9)
NEUTROS ABS: 14.6 10*3/uL — AB (ref 1.4–7.0)
Neutrophils: 84 %
Platelets: 232 10*3/uL (ref 150–379)
RBC: 4.61 x10E6/uL (ref 3.77–5.28)
RDW: 13.3 % (ref 12.3–15.4)
WBC: 17.6 10*3/uL — ABNORMAL HIGH (ref 3.4–10.8)

## 2015-06-16 LAB — LYME AB/WESTERN BLOT REFLEX
LYME DISEASE AB, QUANT, IGM: <0.8 index (ref 0.00–0.79)
Lyme IgG/IgM Ab: <0.91 {ISR} (ref 0.00–0.90)

## 2015-06-16 LAB — ROCKY MTN SPOTTED FVR ABS PNL(IGG+IGM)
RMSF IGG: NEGATIVE
RMSF IgM: 0.45 index (ref 0.00–0.89)

## 2015-06-17 LAB — UPPER RESPIRATORY CULTURE, ROUTINE

## 2015-12-06 ENCOUNTER — Telehealth: Payer: Self-pay | Admitting: Physician Assistant

## 2016-01-16 DIAGNOSIS — Z0100 Encounter for examination of eyes and vision without abnormal findings: Secondary | ICD-10-CM | POA: Diagnosis not present

## 2016-07-25 DIAGNOSIS — S3141XA Laceration without foreign body of vagina and vulva, initial encounter: Secondary | ICD-10-CM | POA: Diagnosis not present

## 2016-07-25 DIAGNOSIS — Z113 Encounter for screening for infections with a predominantly sexual mode of transmission: Secondary | ICD-10-CM | POA: Diagnosis not present

## 2016-07-25 DIAGNOSIS — Z202 Contact with and (suspected) exposure to infections with a predominantly sexual mode of transmission: Secondary | ICD-10-CM | POA: Diagnosis not present

## 2016-10-20 DIAGNOSIS — F411 Generalized anxiety disorder: Secondary | ICD-10-CM | POA: Diagnosis not present

## 2016-10-29 DIAGNOSIS — J111 Influenza due to unidentified influenza virus with other respiratory manifestations: Secondary | ICD-10-CM | POA: Diagnosis not present

## 2016-10-29 DIAGNOSIS — J00 Acute nasopharyngitis [common cold]: Secondary | ICD-10-CM | POA: Diagnosis not present

## 2016-12-10 DIAGNOSIS — D2239 Melanocytic nevi of other parts of face: Secondary | ICD-10-CM | POA: Diagnosis not present

## 2016-12-10 DIAGNOSIS — D225 Melanocytic nevi of trunk: Secondary | ICD-10-CM | POA: Diagnosis not present

## 2016-12-10 DIAGNOSIS — L718 Other rosacea: Secondary | ICD-10-CM | POA: Diagnosis not present

## 2016-12-31 DIAGNOSIS — Z0100 Encounter for examination of eyes and vision without abnormal findings: Secondary | ICD-10-CM | POA: Diagnosis not present

## 2017-02-09 DIAGNOSIS — Z130 Encounter for screening for diseases of the blood and blood-forming organs and certain disorders involving the immune mechanism: Secondary | ICD-10-CM | POA: Diagnosis not present

## 2017-02-09 DIAGNOSIS — Z124 Encounter for screening for malignant neoplasm of cervix: Secondary | ICD-10-CM | POA: Diagnosis not present

## 2017-02-09 DIAGNOSIS — Z1389 Encounter for screening for other disorder: Secondary | ICD-10-CM | POA: Diagnosis not present

## 2017-02-09 DIAGNOSIS — R8290 Unspecified abnormal findings in urine: Secondary | ICD-10-CM | POA: Diagnosis not present

## 2017-02-09 DIAGNOSIS — Z01419 Encounter for gynecological examination (general) (routine) without abnormal findings: Secondary | ICD-10-CM | POA: Diagnosis not present

## 2017-02-09 DIAGNOSIS — Z1151 Encounter for screening for human papillomavirus (HPV): Secondary | ICD-10-CM | POA: Diagnosis not present

## 2017-02-09 DIAGNOSIS — Z30431 Encounter for routine checking of intrauterine contraceptive device: Secondary | ICD-10-CM | POA: Diagnosis not present

## 2017-02-09 DIAGNOSIS — Z6835 Body mass index (BMI) 35.0-35.9, adult: Secondary | ICD-10-CM | POA: Diagnosis not present

## 2017-04-20 DIAGNOSIS — F411 Generalized anxiety disorder: Secondary | ICD-10-CM | POA: Diagnosis not present

## 2018-07-28 ENCOUNTER — Other Ambulatory Visit: Payer: Self-pay

## 2018-07-28 ENCOUNTER — Encounter (HOSPITAL_COMMUNITY): Payer: Self-pay | Admitting: *Deleted

## 2018-08-10 NOTE — H&P (Signed)
Lindsay [name] is an 37 y.o. female G2P2002 desires definitve contraception.  D/W pt various reversible methods - desires permanent.  D/W pt BTL by B salpingectomy.  Understands permanence of method.  Also desires IUD to be removed.  D/W pt r/b/a of LTCS w B salpingectomy.  Voices understanding, wishs to proceed.    Pertinent Gynecological History: OB History: G2, P2002 LTCS x 2 - 37 and 39 week.    No STD + abn pap, recent WNL x HR HPV x 2, colpo w + ECC  Recent LEEP - clear margins   Menstrual History:  No LMP recorded. (Menstrual status: IUD).    Past Medical History:  Diagnosis Date  . ADD (attention deficit disorder)   . Anxiety     Past Surgical History:  Procedure Laterality Date  . CESAREAN SECTION    . gum graft    . WISDOM TOOTH EXTRACTION     FH: DM< HTN, CVA, breast Cancer   Social History:  reports that she has never smoked. She has never used smokeless tobacco. She reports that she drinks alcohol. She reports that she does not use drugs.respirTORY THERAPIST, MARRIED    Allergies: No Known Allergies Meds: celexa, Xanax, Mirena, was on phentermine; treitonin   Review of Systems  Constitutional: Negative.   HENT: Negative.   Eyes: Negative.   Respiratory: Negative.   Cardiovascular: Negative.   Gastrointestinal: Negative.   Genitourinary: Negative.   Musculoskeletal: Negative.   Skin: Negative.   Neurological: Negative.   Psychiatric/Behavioral: Negative.     Height 5\' 3"  (1.6 m), weight 86.2 kg. Physical Exam  Constitutional: She is oriented to person, place, and time. She appears well-developed and well-nourished.  HENT:  Head: Normocephalic and atraumatic.  Cardiovascular: Normal rate and regular rhythm.  Respiratory: Breath sounds normal. No respiratory distress. She has no wheezes.  GI: Soft. Bowel sounds are normal. She exhibits no distension. There is no tenderness.  Musculoskeletal: Normal range of motion.  Neurological: She is alert  and oriented to person, place, and time.  Skin: Skin is warm and dry.  Psychiatric: She has a normal mood and affect. Her behavior is normal.  desires BTL after discussion of    Assessment/Plan: 37yo G2P2 desires definitive contra - d/w pt various options of contra Desires definitive mgmt D/w pt r/b/a of BTL w BS Desires to proceed   Alexius Ellington Bovard-Stuckert 08/10/2018, 1:16 PM

## 2018-08-12 ENCOUNTER — Encounter (HOSPITAL_COMMUNITY): Payer: Self-pay | Admitting: Anesthesiology

## 2018-08-12 NOTE — Anesthesia Preprocedure Evaluation (Addendum)
Anesthesia Evaluation  Patient identified by MRN, date of birth, ID band Patient awake    Reviewed: Allergy & Precautions, NPO status , Patient's Chart, lab work & pertinent test results  Airway Mallampati: I  TM Distance: >3 FB Neck ROM: Full    Dental no notable dental hx. (+) Teeth Intact   Pulmonary neg pulmonary ROS,    Pulmonary exam normal        Cardiovascular negative cardio ROS Normal cardiovascular exam Rhythm:Regular Rate:Normal     Neuro/Psych PSYCHIATRIC DISORDERS Anxiety ADDnegative neurological ROS     GI/Hepatic negative GI ROS, Neg liver ROS,   Endo/Other  Obesity  Renal/GU negative Renal ROS  negative genitourinary   Musculoskeletal negative musculoskeletal ROS (+)   Abdominal Normal abdominal exam  (+) + obese,   Peds  Hematology   Anesthesia Other Findings   Reproductive/Obstetrics Desires sterilization                           Anesthesia Physical Anesthesia Plan  ASA: II  Anesthesia Plan: General   Post-op Pain Management:    Induction: Intravenous  PONV Risk Score and Plan: 4 or greater and Scopolamine patch - Pre-op, Midazolam, Dexamethasone, Ondansetron and Treatment may vary due to age or medical condition  Airway Management Planned: Oral ETT  Additional Equipment:   Intra-op Plan:   Post-operative Plan: Extubation in OR  Informed Consent: I have reviewed the patients History and Physical, chart, labs and discussed the procedure including the risks, benefits and alternatives for the proposed anesthesia with the patient or authorized representative who has indicated his/her understanding and acceptance.   Dental advisory given  Plan Discussed with: CRNA and Surgeon  Anesthesia Plan Comments:        Anesthesia Quick Evaluation

## 2018-08-13 ENCOUNTER — Other Ambulatory Visit: Payer: Self-pay

## 2018-08-13 ENCOUNTER — Ambulatory Visit (HOSPITAL_COMMUNITY)
Admission: AD | Admit: 2018-08-13 | Discharge: 2018-08-13 | Disposition: A | Discharge status: Home / Self Care | Payer: Managed Care, Other (non HMO) | Source: Ambulatory Visit | Attending: Obstetrics and Gynecology | Admitting: Obstetrics and Gynecology

## 2018-08-13 ENCOUNTER — Ambulatory Visit (HOSPITAL_COMMUNITY): Payer: Managed Care, Other (non HMO) | Admitting: Anesthesiology

## 2018-08-13 ENCOUNTER — Encounter (HOSPITAL_COMMUNITY)
Admission: AD | Disposition: A | Discharge status: Home / Self Care | Payer: Self-pay | Source: Ambulatory Visit | Attending: Obstetrics and Gynecology

## 2018-08-13 ENCOUNTER — Encounter (HOSPITAL_COMMUNITY): Payer: Self-pay

## 2018-08-13 DIAGNOSIS — F419 Anxiety disorder, unspecified: Secondary | ICD-10-CM | POA: Diagnosis not present

## 2018-08-13 DIAGNOSIS — Z6833 Body mass index (BMI) 33.0-33.9, adult: Secondary | ICD-10-CM | POA: Insufficient documentation

## 2018-08-13 DIAGNOSIS — E669 Obesity, unspecified: Secondary | ICD-10-CM | POA: Insufficient documentation

## 2018-08-13 DIAGNOSIS — Z302 Encounter for sterilization: Secondary | ICD-10-CM | POA: Insufficient documentation

## 2018-08-13 HISTORY — PX: LAPAROSCOPIC BILATERAL SALPINGECTOMY: SHX5889

## 2018-08-13 HISTORY — PX: IUD REMOVAL: SHX5392

## 2018-08-13 LAB — CBC
HCT: 44.6 % (ref 36.0–46.0)
Hemoglobin: 15.2 g/dL — ABNORMAL HIGH (ref 12.0–15.0)
MCH: 31.9 pg (ref 26.0–34.0)
MCHC: 34.1 g/dL (ref 30.0–36.0)
MCV: 93.5 fL (ref 80.0–100.0)
Platelets: 274 10*3/uL (ref 150–400)
RBC: 4.77 MIL/uL (ref 3.87–5.11)
RDW: 13.3 % (ref 11.5–15.5)
WBC: 10.7 10*3/uL — AB (ref 4.0–10.5)
nRBC: 0 % (ref 0.0–0.2)

## 2018-08-13 LAB — PREGNANCY, URINE: Preg Test, Ur: NEGATIVE

## 2018-08-13 SURGERY — SALPINGECTOMY, BILATERAL, LAPAROSCOPIC
Anesthesia: General

## 2018-08-13 MED ORDER — PHENYLEPHRINE HCL 10 MG/ML IJ SOLN
INTRAMUSCULAR | Status: DC | PRN
  Administered 2018-08-13: 80 ug via INTRAVENOUS

## 2018-08-13 MED ORDER — KETOROLAC TROMETHAMINE 30 MG/ML IJ SOLN
INTRAMUSCULAR | Status: DC | PRN
  Administered 2018-08-13: 30 mg via INTRAVENOUS

## 2018-08-13 MED ORDER — FENTANYL CITRATE (PF) 100 MCG/2ML IJ SOLN
INTRAMUSCULAR | Status: DC | PRN
  Administered 2018-08-13: 50 ug via INTRAVENOUS
  Administered 2018-08-13: 100 ug via INTRAVENOUS
  Administered 2018-08-13: 50 ug via INTRAVENOUS
  Administered 2018-08-13: 100 ug via INTRAVENOUS

## 2018-08-13 MED ORDER — DEXAMETHASONE SODIUM PHOSPHATE 4 MG/ML IJ SOLN
INTRAMUSCULAR | Status: AC
  Filled 2018-08-13: qty 1

## 2018-08-13 MED ORDER — SODIUM CHLORIDE (PF) 0.9 % IJ SOLN
INTRAMUSCULAR | Status: AC
  Filled 2018-08-13: qty 10

## 2018-08-13 MED ORDER — BUPIVACAINE HCL (PF) 0.25 % IJ SOLN
INTRAMUSCULAR | Status: DC | PRN
  Administered 2018-08-13: 16 mL
  Administered 2018-08-13: 4 mL

## 2018-08-13 MED ORDER — ROCURONIUM BROMIDE 100 MG/10ML IV SOLN
INTRAVENOUS | Status: AC
  Filled 2018-08-13: qty 1

## 2018-08-13 MED ORDER — MIDAZOLAM HCL 2 MG/2ML IJ SOLN
INTRAMUSCULAR | Status: DC | PRN
  Administered 2018-08-13: 2 mg via INTRAVENOUS

## 2018-08-13 MED ORDER — BUPIVACAINE HCL (PF) 0.25 % IJ SOLN
INTRAMUSCULAR | Status: AC
  Filled 2018-08-13: qty 30

## 2018-08-13 MED ORDER — PROPOFOL 10 MG/ML IV BOLUS
INTRAVENOUS | Status: AC
  Filled 2018-08-13: qty 20

## 2018-08-13 MED ORDER — OXYCODONE HCL 5 MG PO TABS: 5.0000 mg | ORAL_TABLET | Freq: Four times a day (QID) | ORAL | 0 refills | Status: DC | PRN

## 2018-08-13 MED ORDER — LIDOCAINE HCL (CARDIAC) PF 100 MG/5ML IV SOSY
PREFILLED_SYRINGE | INTRAVENOUS | Status: AC
  Filled 2018-08-13: qty 5

## 2018-08-13 MED ORDER — SCOPOLAMINE 1 MG/3DAYS TD PT72
MEDICATED_PATCH | TRANSDERMAL | Status: AC
  Administered 2018-08-13: 1.5 mg via TRANSDERMAL
  Filled 2018-08-13: qty 1

## 2018-08-13 MED ORDER — SUGAMMADEX SODIUM 200 MG/2ML IV SOLN
INTRAVENOUS | Status: AC
  Filled 2018-08-13: qty 2

## 2018-08-13 MED ORDER — SCOPOLAMINE 1 MG/3DAYS TD PT72
1.0000 | MEDICATED_PATCH | Freq: Once | TRANSDERMAL | Status: DC
  Administered 2018-08-13: 1.5 mg via TRANSDERMAL

## 2018-08-13 MED ORDER — SUGAMMADEX SODIUM 200 MG/2ML IV SOLN
INTRAVENOUS | Status: DC | PRN
  Administered 2018-08-13: 200 mg via INTRAVENOUS

## 2018-08-13 MED ORDER — DEXAMETHASONE SODIUM PHOSPHATE 4 MG/ML IJ SOLN
INTRAMUSCULAR | Status: DC | PRN
  Administered 2018-08-13: 4 mg via INTRAVENOUS

## 2018-08-13 MED ORDER — MIDAZOLAM HCL 2 MG/2ML IJ SOLN
INTRAMUSCULAR | Status: AC
  Filled 2018-08-13: qty 2

## 2018-08-13 MED ORDER — ONDANSETRON HCL 4 MG/2ML IJ SOLN
INTRAMUSCULAR | Status: DC | PRN
  Administered 2018-08-13: 4 mg via INTRAVENOUS

## 2018-08-13 MED ORDER — ROCURONIUM BROMIDE 100 MG/10ML IV SOLN
INTRAVENOUS | Status: DC | PRN
  Administered 2018-08-13: 40 mg via INTRAVENOUS

## 2018-08-13 MED ORDER — LACTATED RINGERS IV SOLN
INTRAVENOUS | Status: DC
  Administered 2018-08-13 (×2): via INTRAVENOUS

## 2018-08-13 MED ORDER — LIDOCAINE HCL (CARDIAC) PF 100 MG/5ML IV SOSY
PREFILLED_SYRINGE | INTRAVENOUS | Status: DC | PRN
  Administered 2018-08-13: 80 mg via INTRAVENOUS

## 2018-08-13 MED ORDER — IBUPROFEN 600 MG PO TABS: 600.0000 mg | ORAL_TABLET | Freq: Four times a day (QID) | ORAL | 1 refills | Status: DC | PRN

## 2018-08-13 MED ORDER — ONDANSETRON HCL 4 MG/2ML IJ SOLN
INTRAMUSCULAR | Status: AC
  Filled 2018-08-13: qty 2

## 2018-08-13 MED ORDER — FENTANYL CITRATE (PF) 100 MCG/2ML IJ SOLN
INTRAMUSCULAR | Status: AC
  Filled 2018-08-13: qty 2

## 2018-08-13 MED ORDER — PROPOFOL 10 MG/ML IV BOLUS
INTRAVENOUS | Status: DC | PRN
  Administered 2018-08-13: 160 mg via INTRAVENOUS

## 2018-08-13 MED ORDER — LACTATED RINGERS IV SOLN
INTRAVENOUS | Status: DC
  Administered 2018-08-13: 125 mL/h via INTRAVENOUS

## 2018-08-13 MED ORDER — FENTANYL CITRATE (PF) 250 MCG/5ML IJ SOLN
INTRAMUSCULAR | Status: AC
  Filled 2018-08-13: qty 5

## 2018-08-13 MED ORDER — KETOROLAC TROMETHAMINE 30 MG/ML IJ SOLN
INTRAMUSCULAR | Status: AC
  Filled 2018-08-13: qty 1

## 2018-08-13 SURGICAL SUPPLY — 35 items
ADH SKN CLS APL DERMABOND .7 (GAUZE/BANDAGES/DRESSINGS) ×2
BAG SPEC RTRVL LRG 6X4 10 (ENDOMECHANICALS) ×2
CABLE HIGH FREQUENCY MONO STRZ (ELECTRODE) IMPLANT
CATH ROBINSON RED A/P 16FR (CATHETERS) ×2 IMPLANT
DERMABOND ADVANCED (GAUZE/BANDAGES/DRESSINGS) ×1
DERMABOND ADVANCED .7 DNX12 (GAUZE/BANDAGES/DRESSINGS) ×2 IMPLANT
DRSG OPSITE POSTOP 3X4 (GAUZE/BANDAGES/DRESSINGS) ×3 IMPLANT
DURAPREP 26ML APPLICATOR (WOUND CARE) ×3 IMPLANT
ELECT REM PT RETURN 9FT ADLT (ELECTROSURGICAL) ×3
ELECTRODE REM PT RTRN 9FT ADLT (ELECTROSURGICAL) ×2 IMPLANT
GLOVE BIO SURGEON STRL SZ 6.5 (GLOVE) ×3 IMPLANT
GLOVE BIOGEL PI IND STRL 7.0 (GLOVE) ×6 IMPLANT
GLOVE BIOGEL PI INDICATOR 7.0 (GLOVE) ×3
GOWN STRL REUS W/TWL LRG LVL3 (GOWN DISPOSABLE) ×6 IMPLANT
NDL SPNL 22GX3.5 QUINCKE BK (NEEDLE) ×2 IMPLANT
NEEDLE SPNL 22GX3.5 QUINCKE BK (NEEDLE) ×3 IMPLANT
NS IRRIG 1000ML POUR BTL (IV SOLUTION) ×3 IMPLANT
PACK LAPAROSCOPY BASIN (CUSTOM PROCEDURE TRAY) ×3 IMPLANT
PACK TRENDGUARD 450 HYBRID PRO (MISCELLANEOUS) IMPLANT
PACK VAGINAL MINOR WOMEN LF (CUSTOM PROCEDURE TRAY) ×3 IMPLANT
PAD PREP 24X48 CUFFED NSTRL (MISCELLANEOUS) ×3 IMPLANT
POUCH SPECIMEN RETRIEVAL 10MM (ENDOMECHANICALS) ×1 IMPLANT
PROTECTOR NERVE ULNAR (MISCELLANEOUS) ×6 IMPLANT
SET IRRIG TUBING LAPAROSCOPIC (IRRIGATION / IRRIGATOR) IMPLANT
SHEARS HARMONIC ACE PLUS 36CM (ENDOMECHANICALS) ×3 IMPLANT
SLEEVE XCEL OPT CAN 5 100 (ENDOMECHANICALS) ×3 IMPLANT
SUT VICRYL 0 UR6 27IN ABS (SUTURE) ×4 IMPLANT
SUT VICRYL 4-0 PS2 18IN ABS (SUTURE) ×4 IMPLANT
TOWEL OR 17X24 6PK STRL BLUE (TOWEL DISPOSABLE) ×6 IMPLANT
TRAY FOLEY W/BAG SLVR 14FR (SET/KITS/TRAYS/PACK) ×1 IMPLANT
TRENDGUARD 450 HYBRID PRO PACK (MISCELLANEOUS)
TROCAR BALLN 12MMX100 BLUNT (TROCAR) ×3 IMPLANT
TROCAR XCEL NON-BLD 5MMX100MML (ENDOMECHANICALS) ×3 IMPLANT
TUBING INSUF HEATED (TUBING) ×3 IMPLANT
WARMER LAPAROSCOPE (MISCELLANEOUS) ×3 IMPLANT

## 2018-08-13 NOTE — Discharge Instructions (Signed)
DISCHARGE INSTRUCTIONS: Laparoscopy ° °The following instructions have been prepared to help you care for yourself upon your return home today. ° °Wound care: °• Do not get the incision wet for the first 24 hours. The incision should be kept clean and dry. °• The Band-Aids or dressings may be removed the day after surgery. °• Should the incision become sore, red, and swollen after the first week, check with your doctor. ° °Personal hygiene: °• Shower the day after your procedure. ° °Activity and limitations: °• Do NOT drive or operate any equipment today. °• Do NOT lift anything more than 15 pounds for 2-3 weeks after surgery. °• Do NOT rest in bed all day. °• Walking is encouraged. Walk each day, starting slowly with 5-minute walks 3 or 4 times a day. Slowly increase the length of your walks. °• Walk up and down stairs slowly. °• Do NOT do strenuous activities, such as golfing, playing tennis, bowling, running, biking, weight lifting, gardening, mowing, or vacuuming for 2-4 weeks. Ask your doctor when it is okay to start. ° °Diet: Eat a light meal as desired this evening. You may resume your usual diet tomorrow. ° °Return to work: This is dependent on the type of work you do. For the most part you can return to a desk job within a week of surgery. If you are more active at work, please discuss this with your doctor. ° °What to expect after your surgery: You may have a slight burning sensation when you urinate on the first day. You may have a very small amount of blood in the urine. Expect to have a small amount of vaginal discharge/light bleeding for 1-2 weeks. It is not unusual to have abdominal soreness and bruising for up to 2 weeks. You may be tired and need more rest for about 1 week. You may experience shoulder pain for 24-72 hours. Lying flat in bed may relieve it. ° °Call your doctor for any of the following: °• Develop a fever of 100.4 or greater °• Inability to urinate 6 hours after discharge from  hospital °• Severe pain not relieved by pain medications °• Persistent of heavy bleeding at incision site °• Redness or swelling around incision site after a week °• Increasing nausea or vomiting ° °Patient Signature________________________________________ °Nurse Signature_________________________________________DISCHARGE INSTRUCTIONS: Laparoscopy ° °The following instructions have been prepared to help you care for yourself upon your return home today. ° °Wound care: °• Do not get the incision wet for the first 24 hours. The incision should be kept clean and dry. °• The Band-Aids or dressings may be removed the day after surgery. °• Should the incision become sore, red, and swollen after the first week, check with your doctor. ° °Personal hygiene: °• Shower the day after your procedure. ° °Activity and limitations: °• Do NOT drive or operate any equipment today. °• Do NOT lift anything more than 15 pounds for 2-3 weeks after surgery. °• Do NOT rest in bed all day. °• Walking is encouraged. Walk each day, starting slowly with 5-minute walks 3 or 4 times a day. Slowly increase the length of your walks. °• Walk up and down stairs slowly. °• Do NOT do strenuous activities, such as golfing, playing tennis, bowling, running, biking, weight lifting, gardening, mowing, or vacuuming for 2-4 weeks. Ask your doctor when it is okay to start. ° °Diet: Eat a light meal as desired this evening. You may resume your usual diet tomorrow. ° °Return to work: This is dependent on   the type of work you do. For the most part you can return to a desk job within a week of surgery. If you are more active at work, please discuss this with your doctor. ° °What to expect after your surgery: You may have a slight burning sensation when you urinate on the first day. You may have a very small amount of blood in the urine. Expect to have a small amount of vaginal discharge/light bleeding for 1-2 weeks. It is not unusual to have abdominal soreness  and bruising for up to 2 weeks. You may be tired and need more rest for about 1 week. You may experience shoulder pain for 24-72 hours. Lying flat in bed may relieve it. ° °Call your doctor for any of the following: °• Develop a fever of 100.4 or greater °• Inability to urinate 6 hours after discharge from hospital °• Severe pain not relieved by pain medications °• Persistent of heavy bleeding at incision site °• Redness or swelling around incision site after a week °• Increasing nausea or vomiting ° °Patient Signature________________________________________ °Nurse Signature_________________________________________ °

## 2018-08-13 NOTE — Anesthesia Procedure Notes (Signed)
Procedure Name: Intubation Date/Time: 08/13/2018 8:04 AM Performed by: Genevie Ann, CRNA Pre-anesthesia Checklist: Patient identified, Emergency Drugs available, Suction available, Patient being monitored and Timeout performed Patient Re-evaluated:Patient Re-evaluated prior to induction Oxygen Delivery Method: Circle system utilized Preoxygenation: Pre-oxygenation with 100% oxygen Induction Type: IV induction Ventilation: Mask ventilation without difficulty Laryngoscope Size: Mac and 3 Grade View: Grade I Tube type: Oral Tube size: 7.0 mm Number of attempts: 1 Placement Confirmation: ETT inserted through vocal cords under direct vision,  positive ETCO2 and breath sounds checked- equal and bilateral Secured at: 21 cm Tube secured with: Tape (at lips) Dental Injury: Teeth and Oropharynx as per pre-operative assessment

## 2018-08-13 NOTE — Interval H&P Note (Signed)
History and Physical Interval Note:  08/13/2018 7:25 AM  Lindsay [name]  has presented today for surgery, with the diagnosis of desires sterilization  The various methods of treatment have been discussed with the patient and family. After consideration of risks, benefits and other options for treatment, the patient has consented to  Procedure(s): LAPAROSCOPIC BILATERAL SALPINGECTOMY (Bilateral) INTRAUTERINE DEVICE (IUD) REMOVAL (N/A) as a surgical intervention .  The patient's history has been reviewed, patient examined, no change in status, stable for surgery.  I have reviewed the patient's chart and labs.  Questions were answered to the patient's satisfaction.     Alpa Salvo Bovard-Stuckert

## 2018-08-13 NOTE — Anesthesia Postprocedure Evaluation (Signed)
Anesthesia Post Note  Patient: Lindsay [name]  Procedure(s) Performed: LAPAROSCOPIC BILATERAL SALPINGECTOMY (Bilateral ) INTRAUTERINE DEVICE (IUD) REMOVAL (N/A )     Patient location during evaluation: PACU Anesthesia Type: General Level of consciousness: awake and alert and oriented Pain management: pain level controlled Vital Signs Assessment: post-procedure vital signs reviewed and stable Respiratory status: spontaneous breathing, nonlabored ventilation and respiratory function stable Cardiovascular status: blood pressure returned to baseline and stable Postop Assessment: no apparent nausea or vomiting Anesthetic complications: no    Last Vitals:  Vitals:   08/13/18 1000 08/13/18 1003  BP: 114/77   Pulse: 72 72  Resp: 18 12  Temp: 36.6 C   SpO2: 97% 97%    Last Pain:  Vitals:   08/13/18 1000  TempSrc: Oral  PainSc:    Pain Goal: Patients Stated Pain Goal: 5 (08/13/18 0701)               Alejandrina Raimer A.

## 2018-08-13 NOTE — Brief Op Note (Signed)
08/13/2018  9:22 AM  PATIENT:  Stephens November  37 y.o. female  PRE-OPERATIVE DIAGNOSIS:  desires sterilization  POST-OPERATIVE DIAGNOSIS:  desires sterilization  PROCEDURE:  Procedure(s): LAPAROSCOPIC BILATERAL SALPINGECTOMY (Bilateral) INTRAUTERINE DEVICE (IUD) REMOVAL (N/A) Bilateral tubal ligation  SURGEON:  Surgeon(s) and Role:    * Bovard-Stuckert, Khamarion Bjelland, MD - Primary  ANESTHESIA:   local and general  EBL:  10 mL IVF and uop per anesthesia  BLOOD ADMINISTERED:none  DRAINS: none   LOCAL MEDICATIONS USED:  MARCAINE     SPECIMEN:  Source of Specimen:  B tubal segments  DISPOSITION OF SPECIMEN:  PATHOLOGY  COUNTS:  YES  TOURNIQUET:  * No tourniquets in log *  DICTATION: .Other Dictation: Dictation Number [id number]  PLAN OF CARE: Discharge to home after PACU  PATIENT DISPOSITION:  PACU - hemodynamically stable.   Delay start of Pharmacological VTE agent (>24hrs) due to surgical blood loss or risk of bleeding: not applicable

## 2018-08-13 NOTE — Transfer of Care (Signed)
Immediate Anesthesia Transfer of Care Note  Patient: Lindsay [name]  Procedure(s) Performed: LAPAROSCOPIC BILATERAL SALPINGECTOMY (Bilateral ) INTRAUTERINE DEVICE (IUD) REMOVAL (N/A )  Patient Location: PACU  Anesthesia Type:General  Level of Consciousness: awake and alert   Airway & Oxygen Therapy: Patient Spontanous Breathing and Patient connected to nasal cannula oxygen  Post-op Assessment: Report given to RN and Post -op Vital signs reviewed and stable  Post vital signs: Reviewed and stable  Last Vitals:  Vitals Value Taken Time  BP    Temp    Pulse 90 08/13/2018  9:11 AM  Resp 16 08/13/2018  9:11 AM  SpO2 95 % 08/13/2018  9:11 AM  Vitals shown include unvalidated device data.  Last Pain:  Vitals:   08/13/18 0701  TempSrc: Oral  PainSc: 3       Patients Stated Pain Goal: 5 (54/49/20 1007)  Complications: No apparent anesthesia complications

## 2018-08-13 NOTE — Op Note (Signed)
Lindsay [name], [facility] MEDICAL RECORD [id number] ACCOUNT [id number] DATE OF BIRTH:25-Feb-1981 FACILITY: [facility] LOCATION: [facility] PHYSICIAN:Adaley Kiene [name], MD  OPERATIVE REPORT  DATE OF PROCEDURE:  08/13/2018  PREOPERATIVE DIAGNOSIS:  Desires sterilization.  POSTOPERATIVE DIAGNOSIS:  Desires sterilization.  PROCEDURE:  Bilateral tubal ligation by partial salpingectomy, IUD removal.  SURGEON:  Janyth Contes, M.D.  ANESTHESIA:  General with local.  ESTIMATED BLOOD LOSS:  Approximately 10 mL.  URINE OUTPUT AND  INTRAVENOUS FLUIDS:  Per Anesthesia.  COMPLICATIONS:  None.  PATHOLOGY:  Bilateral tubal segments to pathology.  DESCRIPTION OF PROCEDURE:  After informed consent was reviewed with the patient including risks, benefits and alternatives of the surgical procedure as well as permanent, she was transported to the OR, placed on the table in supine position.  General  anesthesia was induced and found to be adequate.  She was then placed in the [place] stirrups, prepped and draped in the normal sterile fashion.  A Foley catheter was placed sterilely.  Using an open-sided speculum, her cervix was grasped with a  single-tooth tenaculum and a Hulka manipulator was placed after a polyp remover was used to remove her IUD in entirety.  Gloves and gown were changed and attention was turned to the abdominal portion of the case.  An approximately 1 cm infraumbilical  incision was made.  The fascia was cleared off with a hemostat, elevated and the fascia was incised, the anterior sheath and then the posterior sheath.  A stay suture was placed.  The 10 trocar was placed under direct visualization.  The pelvis was  visualized revealing normal uterus, tubes and ovaries bilaterally, normal appendix.  The left tube was grasped at the fimbriated end and excised using Harmonic scalpel to the level of the cornua.  This was placed in the posterior cul-de-sac.  On the  right tube, a single  adhesion was noted from the bowel to the sidewall.  This was lysed.  The bowel was removed from the pelvis.  The tube was grasped at the fimbriated end and excised to the level of the cornua with the Harmonic scalpel.  Hemostasis was  assured using the EndoCatch bag.  Both tubes were scooped into the bag and removed in their entirety.  Again, the pressure was lowered and hemostasis was assured.  The accessory ports were removed under direct visualization.  The pneumoperitoneum was  evacuated and the port was removed.  The stay suture was used to close the fascia.  The deep stitch was placed also at the umbilicus.  Skin was closed with 4-0 Vicryl.  Dermabond was also applied.    The patient tolerated the procedure well.  Sponge, lap and needle counts were correct x2 per the operating room  staff.  AN/NUANCE  D:08/13/2018 T:08/13/2018 JOB:[id number]

## 2018-08-14 ENCOUNTER — Encounter (HOSPITAL_COMMUNITY): Payer: Self-pay | Admitting: Obstetrics and Gynecology

## 2020-05-15 ENCOUNTER — Other Ambulatory Visit: Payer: Self-pay

## 2020-05-15 ENCOUNTER — Other Ambulatory Visit: Payer: Self-pay | Admitting: Cardiology

## 2020-05-15 DIAGNOSIS — Z20822 Contact with and (suspected) exposure to covid-19: Secondary | ICD-10-CM

## 2020-05-16 LAB — SARS-COV-2, NAA 2 DAY TAT

## 2020-05-16 LAB — NOVEL CORONAVIRUS, NAA: SARS-CoV-2, NAA: NOT DETECTED

## 2021-06-06 ENCOUNTER — Other Ambulatory Visit: Payer: Self-pay

## 2021-06-06 ENCOUNTER — Ambulatory Visit: Payer: 59 | Admitting: Family Medicine

## 2021-06-06 ENCOUNTER — Encounter: Payer: Self-pay | Admitting: Family Medicine

## 2021-06-06 VITALS — BP 125/86 | HR 76 | Temp 98.0°F | Ht 63.0 in | Wt 218.1 lb

## 2021-06-06 DIAGNOSIS — Z23 Encounter for immunization: Secondary | ICD-10-CM

## 2021-06-06 DIAGNOSIS — R7301 Impaired fasting glucose: Secondary | ICD-10-CM | POA: Diagnosis not present

## 2021-06-06 DIAGNOSIS — Z8632 Personal history of gestational diabetes: Secondary | ICD-10-CM | POA: Insufficient documentation

## 2021-06-06 DIAGNOSIS — E669 Obesity, unspecified: Secondary | ICD-10-CM | POA: Diagnosis not present

## 2021-06-06 DIAGNOSIS — F331 Major depressive disorder, recurrent, moderate: Secondary | ICD-10-CM | POA: Diagnosis not present

## 2021-06-06 DIAGNOSIS — F411 Generalized anxiety disorder: Secondary | ICD-10-CM | POA: Insufficient documentation

## 2021-06-06 DIAGNOSIS — Z7689 Persons encountering health services in other specified circumstances: Secondary | ICD-10-CM

## 2021-06-06 LAB — BAYER DCA HB A1C WAIVED: HB A1C (BAYER DCA - WAIVED): 5.5 % (ref <7.0)

## 2021-06-06 MED ORDER — CITALOPRAM HYDROBROMIDE 40 MG PO TABS: 40.0000 mg | ORAL_TABLET | Freq: Every day | ORAL | 3 refills | Status: DC

## 2021-06-06 MED ORDER — ALPRAZOLAM 0.25 MG PO TABS: 0.2500 mg | ORAL_TABLET | Freq: Two times a day (BID) | ORAL | 0 refills | Status: DC | PRN

## 2021-06-06 NOTE — Progress Notes (Signed)
New Patient Office Visit  Subjective:  Patient ID: Lindsay [name], female    DOB: 1981-04-13  Age: 40 y.o. MRN: [id number]  CC:  Chief Complaint  Patient presents with   New Patient (Initial Visit)    HPI Lindsay [name] presents to establish care. She has a history of gestational diabetes with 10-12 years ago. She reports that she has randomly checked her blood sugar recently and has had elevated reading. For instance, this morning her fasting blood sugar was 140.  She has been taking celexa for years. She has been on and off of this a few times and has been on the 40 mg dosage before in the past. She does feel like it has been helpful. She denies side effects of this. Her previous provided prescribed xanax for her once years ago. She has had the original prescription since this and has never gotten a refill. She reports that she has taken this very sparingly and likes to have it on hand for when her anxiety is really intense. She is currently out of this.   Depression screen PHQ 2/9 06/06/2021  Decreased Interest 1  Down, Depressed, Hopeless 1  PHQ - 2 Score 2  Altered sleeping 2  Tired, decreased energy 1  Change in appetite 3  Feeling bad or failure about yourself  1  Trouble concentrating 0  Moving slowly or fidgety/restless 0  Suicidal thoughts 0  PHQ-9 Score 9  Difficult doing work/chores Somewhat difficult   GAD 7 : Generalized Anxiety Score 06/06/2021  Nervous, Anxious, on Edge 2  Control/stop worrying 2  Worry too much - different things 2  Trouble relaxing 2  Restless 0  Easily annoyed or irritable 2  Afraid - awful might happen 0  Total GAD 7 Score 10  Anxiety Difficulty Somewhat difficult     Past Medical History:  Diagnosis Date   ADD (attention deficit disorder)    history   Anxiety    History of gestational diabetes     Past Surgical History:  Procedure Laterality Date   CESAREAN SECTION     gum graft     IUD REMOVAL N/A 08/13/2018   Procedure:  INTRAUTERINE DEVICE (IUD) REMOVAL;  Surgeon: Janyth Contes, MD;  Location: [facility];  Service: Gynecology;  Laterality: N/A;   LAPAROSCOPIC BILATERAL SALPINGECTOMY Bilateral 08/13/2018   Procedure: LAPAROSCOPIC BILATERAL SALPINGECTOMY;  Surgeon: Janyth Contes, MD;  Location: [facility];  Service: Gynecology;  Laterality: Bilateral;   WISDOM TOOTH EXTRACTION      Family History  Problem Relation Age of Onset   Hypertension Mother    Hyperlipidemia Mother    Anxiety disorder Mother    Hypertension Father    Hyperlipidemia Father    Diabetes Father    ADD / ADHD Daughter    Stroke Maternal Grandmother    Heart disease Maternal Grandmother    Diabetes Maternal Grandmother    Cancer Maternal Grandmother        Breast   Arthritis Maternal Grandmother    Heart attack Maternal Grandmother    Diabetes Maternal Grandfather    Arthritis Maternal Grandfather    Diabetes Paternal Grandmother    Stroke Paternal Grandfather    Diabetes Paternal Grandfather     Social History   Socioeconomic History   Marital status: Divorced    Spouse name: Not on file   Number of children: 2   Years of education: 14   Highest education level: Associate degree: academic program  Occupational History  Not on file  Tobacco Use   Smoking status: Former    Packs/day: 0.50    Years: 5.00    Pack years: 2.50    Types: Cigarettes    Quit date: 2018    Years since quitting: 4.6   Smokeless tobacco: Never  Vaping Use   Vaping Use: Never used  Substance and Sexual Activity   Alcohol use: Not Currently    Comment: occasional   Drug use: No   Sexual activity: Yes    Birth control/protection: Surgical  Other Topics Concern   Not on file  Social History Narrative   Not on file   Social Determinants of Health   Financial Resource Strain: Not on file  Food Insecurity: Not on file  Transportation Needs: Not on file  Physical Activity: Not on file  Stress: Not on file  Social Connections:  Not on file  Intimate Partner Violence: Not on file    ROS Review of Systems  Objective:   Today's Vitals: BP 125/86   Pulse 76   Temp 98 F (36.7 C) (Temporal)   Ht '5\' 3"'  (1.6 m)   Wt 218 lb 2 oz (98.9 kg)   BMI 38.64 kg/m   Physical Exam Vitals and nursing note reviewed.  Constitutional:      General: She is not in acute distress.    Appearance: She is not ill-appearing, toxic-appearing or diaphoretic.  Neck:     Thyroid: No thyroid mass, thyromegaly or thyroid tenderness.  Cardiovascular:     Rate and Rhythm: Normal rate and regular rhythm.     Heart sounds: Normal heart sounds. No murmur heard. Pulmonary:     Effort: Pulmonary effort is normal. No respiratory distress.     Breath sounds: Normal breath sounds.  Abdominal:     General: Bowel sounds are normal. There is no distension.     Palpations: Abdomen is soft.     Tenderness: There is no abdominal tenderness. There is no guarding or rebound.  Musculoskeletal:     Right lower leg: No edema.     Left lower leg: No edema.  Skin:    General: Skin is warm and dry.  Neurological:     General: No focal deficit present.     Mental Status: She is alert and oriented to person, place, and time.  Psychiatric:        Mood and Affect: Mood normal.        Behavior: Behavior normal.    Assessment & Plan:   [name] was seen today for new patient (initial visit).  Diagnoses and all orders for this visit:  Elevated fasting blood sugar A1c pending. Discussed starting ozempic if A1c to >7, she is interested in this if needed.  -     Bayer DCA Hb A1c Waived  History of gestational diabetes -     Bayer DCA Hb A1c Waived  Obesity (BMI 30-39.9) Labs pending. Diet and exercise.  -     CBC with Differential/Platelet -     CMP14+EGFR -     Lipid panel -     Thyroid Panel With TSH  Moderate recurrent major depression ([place]) Not well controlled. Denies SI. Increase celexa to 40 mg daily.  -     citalopram (CELEXA) 40 MG  tablet; Take 1 tablet (40 mg total) by mouth daily.  Generalized anxiety disorder Not well controlled. Increase celexa. Discussed that per office policy, controlled substances are not typically prescribed on the first visit. However  I would refill this today for 30 tablets since she does use this so sparingly and if a refill is needed beyond this, this would need to be seen in the office to discuss a CSA and toxassure. PDMP reviewed today, no red flage.  -     citalopram (CELEXA) 40 MG tablet; Take 1 tablet (40 mg total) by mouth daily. -     ALPRAZolam (XANAX) 0.25 MG tablet; Take 1 tablet (0.25 mg total) by mouth 2 (two) times daily as needed for anxiety.  Need for vaccination Tdap today in office.  -     Tdap vaccine greater than or equal to 7yo IM  Encounter to establish care  Follow-up: Return in about 4 weeks (around 07/04/2021).   The patient indicates understanding of these issues and agrees with the plan.  Gwenlyn Perking, FNP

## 2021-06-06 NOTE — Patient Instructions (Signed)

## 2021-06-07 LAB — LIPID PANEL
Chol/HDL Ratio: 4.6 ratio — ABNORMAL HIGH (ref 0.0–4.4)
Cholesterol, Total: 189 mg/dL (ref 100–199)
HDL: 41 mg/dL (ref >39)
LDL Chol Calc (NIH): 121 mg/dL — ABNORMAL HIGH (ref 0–99)
Triglycerides: 154 mg/dL — ABNORMAL HIGH (ref 0–149)
VLDL Cholesterol Cal: 27 mg/dL (ref 5–40)

## 2021-06-07 LAB — CMP14+EGFR
ALT: 18 IU/L (ref 0–32)
AST: 18 IU/L (ref 0–40)
Albumin/Globulin Ratio: 1.8 (ref 1.2–2.2)
Albumin: 4.4 g/dL (ref 3.8–4.8)
Alkaline Phosphatase: 132 IU/L — ABNORMAL HIGH (ref 44–121)
BUN/Creatinine Ratio: 11 (ref 9–23)
BUN: 9 mg/dL (ref 6–24)
Bilirubin Total: 0.6 mg/dL (ref 0.0–1.2)
CO2: 23 mmol/L (ref 20–29)
Calcium: 9.4 mg/dL (ref 8.7–10.2)
Chloride: 100 mmol/L (ref 96–106)
Creatinine, Ser: 0.79 mg/dL (ref 0.57–1.00)
Globulin, Total: 2.4 g/dL (ref 1.5–4.5)
Glucose: 116 mg/dL — ABNORMAL HIGH (ref 65–99)
Potassium: 3.9 mmol/L (ref 3.5–5.2)
Sodium: 140 mmol/L (ref 134–144)
Total Protein: 6.8 g/dL (ref 6.0–8.5)
eGFR: 97 mL/min/{1.73_m2} (ref >59)

## 2021-06-07 LAB — CBC WITH DIFFERENTIAL/PLATELET
Basophils Absolute: 0 10*3/uL (ref 0.0–0.2)
Basos: 0 %
EOS (ABSOLUTE): 0.1 10*3/uL (ref 0.0–0.4)
Eos: 1 %
Hematocrit: 41.4 % (ref 34.0–46.6)
Hemoglobin: 15 g/dL (ref 11.1–15.9)
Immature Grans (Abs): 0 10*3/uL (ref 0.0–0.1)
Immature Granulocytes: 0 %
Lymphocytes Absolute: 2 10*3/uL (ref 0.7–3.1)
Lymphs: 21 %
MCH: 32.4 pg (ref 26.6–33.0)
MCHC: 36.2 g/dL — ABNORMAL HIGH (ref 31.5–35.7)
MCV: 89 fL (ref 79–97)
Monocytes Absolute: 0.5 10*3/uL (ref 0.1–0.9)
Monocytes: 6 %
Neutrophils Absolute: 6.5 10*3/uL (ref 1.4–7.0)
Neutrophils: 72 %
Platelets: 302 10*3/uL (ref 150–450)
RBC: 4.63 x10E6/uL (ref 3.77–5.28)
RDW: 12.4 % (ref 11.7–15.4)
WBC: 9.2 10*3/uL (ref 3.4–10.8)

## 2021-06-07 LAB — THYROID PANEL WITH TSH
Free Thyroxine Index: 2 (ref 1.2–4.9)
T3 Uptake Ratio: 23 % — ABNORMAL LOW (ref 24–39)
T4, Total: 8.6 ug/dL (ref 4.5–12.0)
TSH: 1.49 u[IU]/mL (ref 0.450–4.500)

## 2021-06-10 ENCOUNTER — Encounter: Payer: Self-pay | Admitting: Family Medicine

## 2021-06-24 ENCOUNTER — Other Ambulatory Visit: Payer: Self-pay | Admitting: Family Medicine

## 2021-06-24 MED ORDER — SAXENDA 18 MG/3ML ~~LOC~~ SOPN: PEN_INJECTOR | SUBCUTANEOUS | 0 refills | Status: DC

## 2021-06-24 MED ORDER — INSULIN PEN NEEDLE 32G X 6 MM MISC: 1.0000 | Freq: Every day | 0 refills | Status: DC

## 2021-06-25 ENCOUNTER — Telehealth: Payer: Self-pay | Admitting: *Deleted

## 2021-06-25 NOTE — Telephone Encounter (Signed)
Prior Auth for Saxenda 18mg /9ml-In Process  Key: [id number]  PA #  [id number]   Your information has been submitted to [facility]. To check for an updated outcome later, reopen this PA request from your dashboard.  If [employer] has not responded to your request within 24 hours, contact [facility] at [phone]. If you think there may be a problem with your PA request, use our live chat feature at the bottom right.

## 2021-06-28 NOTE — Telephone Encounter (Signed)
Your prior authorization for [name] has been approved! MORE INFO For eligible patients, copay assistance may be available. To learn more and be redirected to the [facility] website, click on the "More Info" button to the right. Please also note that you may need to schedule a follow-up visit with your patient prior to the expiration of this prior authorization, as updated patient weight may be required for reauthorization.  Message from plan: Your PA request has been approved. Additional information will be provided in the approval communication. (Message 1145)  [facility] aware

## 2021-07-03 ENCOUNTER — Ambulatory Visit: Payer: 59 | Admitting: Family Medicine

## 2021-07-25 ENCOUNTER — Other Ambulatory Visit: Payer: Self-pay | Admitting: Family Medicine

## 2021-07-26 ENCOUNTER — Other Ambulatory Visit: Payer: Self-pay

## 2021-07-26 ENCOUNTER — Ambulatory Visit: Payer: 59 | Admitting: Family Medicine

## 2021-07-26 ENCOUNTER — Encounter: Payer: Self-pay | Admitting: Family Medicine

## 2021-07-26 DIAGNOSIS — F331 Major depressive disorder, recurrent, moderate: Secondary | ICD-10-CM

## 2021-07-26 DIAGNOSIS — N3000 Acute cystitis without hematuria: Secondary | ICD-10-CM

## 2021-07-26 LAB — MICROSCOPIC EXAMINATION
Epithelial Cells (non renal): NONE SEEN /hpf (ref 0–10)
Renal Epithel, UA: NONE SEEN /hpf
WBC, UA: NONE SEEN /hpf (ref 0–5)

## 2021-07-26 LAB — URINALYSIS, ROUTINE W REFLEX MICROSCOPIC
Bilirubin, UA: NEGATIVE
Glucose, UA: NEGATIVE
Ketones, UA: NEGATIVE
Leukocytes,UA: NEGATIVE
Nitrite, UA: POSITIVE — AB
Protein,UA: NEGATIVE
RBC, UA: NEGATIVE
Specific Gravity, UA: 1.015 (ref 1.005–1.030)
Urobilinogen, Ur: 0.2 mg/dL (ref 0.2–1.0)
pH, UA: 7 (ref 5.0–7.5)

## 2021-07-26 MED ORDER — CEPHALEXIN 500 MG PO CAPS: 500.0000 mg | ORAL_CAPSULE | Freq: Two times a day (BID) | ORAL | 0 refills | Status: DC

## 2021-07-26 NOTE — Progress Notes (Signed)
Established Patient Office Visit  Subjective:  Patient ID: Lindsay [name], female    DOB: 1980/11/01  Age: 40 y.o. MRN: [id number]  CC:  Chief Complaint  Patient presents with   Dysuria   Obesity    HPI Lindsay [name] presents for weight management. She had noticed some mild heartburn. She has noticed some slight decreased appetite. She has not been exercising.   She reports dysuria, odor, and dark urine for 1 week. She does report urgency. She denies frequency. Denies fever, chills, vomiting, or flank pain. She took an OTC medication similar to AZO for her symptoms.   Depression screen [facility] 2/9 07/26/2021 06/06/2021  Decreased Interest 0 1  Down, Depressed, Hopeless 0 1  PHQ - 2 Score 0 2  Altered sleeping 0 2  Tired, decreased energy 0 1  Change in appetite 0 3  Feeling bad or failure about yourself  0 1  Trouble concentrating 0 0  Moving slowly or fidgety/restless 0 0  Suicidal thoughts 0 0  PHQ-9 Score 0 9  Difficult doing work/chores Not difficult at all Somewhat difficult   GAD 7 : Generalized Anxiety Score 07/26/2021 06/06/2021  Nervous, Anxious, on Edge 1 2  Control/stop worrying 0 2  Worry too much - different things 0 2  Trouble relaxing 1 2  Restless 0 0  Easily annoyed or irritable 1 2  Afraid - awful might happen 0 0  Total GAD 7 Score 3 10  Anxiety Difficulty Somewhat difficult Somewhat difficult     Past Medical History:  Diagnosis Date   ADD (attention deficit disorder)    history   Anxiety    History of gestational diabetes     Past Surgical History:  Procedure Laterality Date   CESAREAN SECTION     gum graft     IUD REMOVAL N/A 08/13/2018   Procedure: INTRAUTERINE DEVICE (IUD) REMOVAL;  Surgeon: Janyth Contes, MD;  Location: [facility];  Service: Gynecology;  Laterality: N/A;   LAPAROSCOPIC BILATERAL SALPINGECTOMY Bilateral 08/13/2018   Procedure: LAPAROSCOPIC BILATERAL SALPINGECTOMY;  Surgeon: Janyth Contes, MD;  Location: [facility];   Service: Gynecology;  Laterality: Bilateral;   WISDOM TOOTH EXTRACTION      Family History  Problem Relation Age of Onset   Hypertension Mother    Hyperlipidemia Mother    Anxiety disorder Mother    Hypertension Father    Hyperlipidemia Father    Diabetes Father    ADD / ADHD Daughter    Stroke Maternal Grandmother    Heart disease Maternal Grandmother    Diabetes Maternal Grandmother    Cancer Maternal Grandmother        Breast   Arthritis Maternal Grandmother    Heart attack Maternal Grandmother    Diabetes Maternal Grandfather    Arthritis Maternal Grandfather    Diabetes Paternal Grandmother    Stroke Paternal Grandfather    Diabetes Paternal Grandfather     Social History   Socioeconomic History   Marital status: Divorced    Spouse name: Not on file   Number of children: 2   Years of education: 14   Highest education level: Associate degree: academic program  Occupational History   Not on file  Tobacco Use   Smoking status: Former    Packs/day: 0.50    Years: 5.00    Pack years: 2.50    Types: Cigarettes    Quit date: 2018    Years since quitting: 4.8   Smokeless tobacco: Never  Vaping Use  Vaping Use: Never used  Substance and Sexual Activity   Alcohol use: Not Currently    Comment: occasional   Drug use: No   Sexual activity: Yes    Birth control/protection: Surgical  Other Topics Concern   Not on file  Social History Narrative   Not on file   Social Determinants of Health   Financial Resource Strain: Not on file  Food Insecurity: Not on file  Transportation Needs: Not on file  Physical Activity: Not on file  Stress: Not on file  Social Connections: Not on file  Intimate Partner Violence: Not on file    Outpatient Medications Prior to Visit  Medication Sig Dispense Refill   ALPRAZolam (XANAX) 0.25 MG tablet Take 1 tablet (0.25 mg total) by mouth 2 (two) times daily as needed for anxiety. 30 tablet 0   citalopram (CELEXA) 40 MG tablet  Take 1 tablet (40 mg total) by mouth daily. 90 tablet 3   Insulin Pen Needle 32G X 6 MM MISC 1 each by Does not apply route daily. 90 each 0   Liraglutide -Weight Management (SAXENDA) 18 MG/3ML SOPN Inject 1.8 mg into the skin daily x 7 days. Then inject 2.4 mg into the skin daily on days 8-14. Then inject 3.0 mg into the skin daily thereafter. 15 mL 1   No facility-administered medications prior to visit.    No Known Allergies  ROS Review of Systems As per HPI.    Objective:    Physical Exam Vitals and nursing note reviewed.  Constitutional:      General: She is not in acute distress.    Appearance: She is not ill-appearing, toxic-appearing or diaphoretic.  Cardiovascular:     Rate and Rhythm: Normal rate and regular rhythm.     Heart sounds: Normal heart sounds. No murmur heard. Pulmonary:     Effort: Pulmonary effort is normal. No respiratory distress.     Breath sounds: Normal breath sounds.  Abdominal:     General: Bowel sounds are normal. There is no distension.     Palpations: Abdomen is soft.     Tenderness: There is no abdominal tenderness. There is no right CVA tenderness, left CVA tenderness, guarding or rebound.  Musculoskeletal:     Right lower leg: No edema.     Left lower leg: No edema.  Skin:    General: Skin is warm and dry.  Neurological:     General: No focal deficit present.     Mental Status: She is alert and oriented to person, place, and time.  Psychiatric:        Mood and Affect: Mood normal.        Behavior: Behavior normal.    BP 130/76   Pulse 88   Temp 98.3 F (36.8 C) (Temporal)   Ht _0  (1.6 m)   Wt 221 lb 2 oz (100.3 kg)   BMI 39.17 kg/m  Wt Readings from Last 3 Encounters:  07/26/21 221 lb 2 oz (100.3 kg)  06/06/21 218 lb 2 oz (98.9 kg)  07/28/18 190 lb (86.2 kg)     Health Maintenance Due  Topic Date Due   PAP SMEAR-Modifier  02/11/2015    There are no preventive care reminders to display for this patient.  Lab  Results  Component Value Date   TSH 1.490 06/06/2021   Lab Results  Component Value Date   WBC 9.2 06/06/2021   HGB 15.0 06/06/2021   HCT 41.4 06/06/2021   MCV 89  06/06/2021   PLT 302 06/06/2021   Lab Results  Component Value Date   NA 140 06/06/2021   K 3.9 06/06/2021   CO2 23 06/06/2021   GLUCOSE 116 (H) 06/06/2021   BUN 9 06/06/2021   CREATININE 0.79 06/06/2021   BILITOT 0.6 06/06/2021   ALKPHOS 132 (H) 06/06/2021   AST 18 06/06/2021   ALT 18 06/06/2021   PROT 6.8 06/06/2021   ALBUMIN 4.4 06/06/2021   CALCIUM 9.4 06/06/2021   EGFR 97 06/06/2021   Lab Results  Component Value Date   CHOL 189 06/06/2021   Lab Results  Component Value Date   HDL 41 06/06/2021   Lab Results  Component Value Date   LDLCALC 121 (H) 06/06/2021   Lab Results  Component Value Date   TRIG 154 (H) 06/06/2021   Lab Results  Component Value Date   CHOLHDL 4.6 (H) 06/06/2021   Lab Results  Component Value Date   HGBA1C 5.5 06/06/2021      Assessment & Plan:   Lindsay [name] was seen today for dysuria and obesity.  Diagnoses and all orders for this visit:  Morbid obesity ([place]) BMI 39 with depression and HLD. On saxenda. Just started 3 mg dosage. Gained 3 lb since last visit. Discussed diet and exercise today. Continue saxenda and follow up in 4 weeks.   Moderate recurrent depression Well controlled on current regimen.   Acute cystitis without hematuria UA positive for nitrates. Culture pending. Keflex as below.  -     Urinalysis, Routine w reflex microscopic -     Urine Culture -     cephALEXin (KEFLEX) 500 MG capsule; Take 1 capsule (500 mg total) by mouth 2 (two) times daily.   Follow-up: Return in about 4 weeks (around 08/23/2021) for weight.  The patient indicates understanding of these issues and agrees with the plan.    Gwenlyn Perking, FNP

## 2021-07-26 NOTE — Patient Instructions (Signed)
Urinary Tract Infection, Adult A urinary tract infection (UTI) is an infection of any part of the urinary tract. The urinary tract includes the kidneys, ureters, bladder, and urethra. These organs make, store, and get rid of urine in the body. An upper UTI affects the ureters and kidneys. A lower UTI affects the bladder and urethra. What are the causes? Most urinary tract infections are caused by bacteria in your genital area around your urethra, where urine leaves your body. These bacteria grow and cause inflammation of your urinary tract. What increases the risk? You are more likely to develop this condition if: You have a urinary catheter that stays in place. You are not able to control when you urinate or have a bowel movement (incontinence). You are female and you: Use a spermicide or diaphragm for birth control. Have low estrogen levels. Are pregnant. You have certain genes that increase your risk. You are sexually active. You take antibiotic medicines. You have a condition that causes your flow of urine to slow down, such as: An enlarged prostate, if you are female. Blockage in your urethra. A kidney stone. A nerve condition that affects your bladder control (neurogenic bladder). Not getting enough to drink, or not urinating often. You have certain medical conditions, such as: Diabetes. A weak disease-fighting system (immunesystem). Sickle cell disease. Gout. Spinal cord injury. What are the signs or symptoms? Symptoms of this condition include: Needing to urinate right away (urgency). Frequent urination. This may include small amounts of urine each time you urinate. Pain or burning with urination. Blood in the urine. Urine that smells bad or unusual. Trouble urinating. Cloudy urine. Vaginal discharge, if you are female. Pain in the abdomen or the lower back. You may also have: Vomiting or a decreased appetite. Confusion. Irritability or tiredness. A fever or  chills. Diarrhea. The first symptom in older adults may be confusion. In some cases, they may not have any symptoms until the infection has worsened. How is this diagnosed? This condition is diagnosed based on your medical history and a physical exam. You may also have other tests, including: Urine tests. Blood tests. Tests for STIs (sexually transmitted infections). If you have had more than one UTI, a cystoscopy or imaging studies may be done to determine the cause of the infections. How is this treated? Treatment for this condition includes: Antibiotic medicine. Over-the-counter medicines to treat discomfort. Drinking enough water to stay hydrated. If you have frequent infections or have other conditions such as a kidney stone, you may need to see a health care provider who specializes in the urinary tract (urologist). In rare cases, urinary tract infections can cause sepsis. Sepsis is a life-threatening condition that occurs when the body responds to an infection. Sepsis is treated in the hospital with IV antibiotics, fluids, and other medicines. Follow these instructions at home: Medicines Take over-the-counter and prescription medicines only as told by your health care provider. If you were prescribed an antibiotic medicine, take it as told by your health care provider. Do not stop using the antibiotic even if you start to feel better. General instructions Make sure you: Empty your bladder often and completely. Do not hold urine for long periods of time. Empty your bladder after sex. Wipe from front to back after urinating or having a bowel movement if you are female. Use each tissue only one time when you wipe. Drink enough fluid to keep your urine pale yellow. Keep all follow-up visits. This is important. Contact a health care provider   if: Your symptoms do not get better after 1-2 days. Your symptoms go away and then return. Get help right away if: You have severe pain in your  back or your lower abdomen. You have a fever or chills. You have nausea or vomiting. Summary A urinary tract infection (UTI) is an infection of any part of the urinary tract, which includes the kidneys, ureters, bladder, and urethra. Most urinary tract infections are caused by bacteria in your genital area. Treatment for this condition often includes antibiotic medicines. If you were prescribed an antibiotic medicine, take it as told by your health care provider. Do not stop using the antibiotic even if you start to feel better. Keep all follow-up visits. This is important. This information is not intended to replace advice given to you by your health care provider. Make sure you discuss any questions you have with your health care provider. Document Revised: 05/04/2020 Document Reviewed: 05/04/2020 Elsevier Patient Education  2022 [employer]  

## 2021-08-04 LAB — URINE CULTURE

## 2021-08-23 ENCOUNTER — Ambulatory Visit: Payer: 59 | Admitting: Family Medicine

## 2021-08-23 ENCOUNTER — Encounter: Payer: Self-pay | Admitting: Family Medicine

## 2021-08-23 ENCOUNTER — Other Ambulatory Visit: Payer: Self-pay

## 2021-08-23 DIAGNOSIS — Z111 Encounter for screening for respiratory tuberculosis: Secondary | ICD-10-CM | POA: Diagnosis not present

## 2021-08-23 NOTE — Progress Notes (Signed)
Established Patient Office Visit  Subjective:  Patient ID: Lindsay [name], female    DOB: Aug 26, 1981  Age: 40 y.o. MRN: [id number]  CC:  Chief Complaint  Patient presents with   Weight management    HPI Lindsay [name] presents for weight management. She has gained a pound since her last visit. She doesn't feel like this has decreased her appetite. She has not been exercising. She is working on her diet. She reports that the side effect of saxena have now subsided.   She will be working for a new company and needs TB screening done for employment. She also has a form to be completed for this.   Past Medical History:  Diagnosis Date   ADD (attention deficit disorder)    history   Anxiety    History of gestational diabetes     Past Surgical History:  Procedure Laterality Date   CESAREAN SECTION     gum graft     IUD REMOVAL N/A 08/13/2018   Procedure: INTRAUTERINE DEVICE (IUD) REMOVAL;  Surgeon: Janyth Contes, MD;  Location: [facility];  Service: Gynecology;  Laterality: N/A;   LAPAROSCOPIC BILATERAL SALPINGECTOMY Bilateral 08/13/2018   Procedure: LAPAROSCOPIC BILATERAL SALPINGECTOMY;  Surgeon: Janyth Contes, MD;  Location: [facility];  Service: Gynecology;  Laterality: Bilateral;   WISDOM TOOTH EXTRACTION      Family History  Problem Relation Age of Onset   Hypertension Mother    Hyperlipidemia Mother    Anxiety disorder Mother    Hypertension Father    Hyperlipidemia Father    Diabetes Father    ADD / ADHD Daughter    Stroke Maternal Grandmother    Heart disease Maternal Grandmother    Diabetes Maternal Grandmother    Cancer Maternal Grandmother        Breast   Arthritis Maternal Grandmother    Heart attack Maternal Grandmother    Diabetes Maternal Grandfather    Arthritis Maternal Grandfather    Diabetes Paternal Grandmother    Stroke Paternal Grandfather    Diabetes Paternal Grandfather     Social History   Socioeconomic History   Marital status:  Divorced    Spouse name: Not on file   Number of children: 2   Years of education: 14   Highest education level: Associate degree: academic program  Occupational History   Not on file  Tobacco Use   Smoking status: Former    Packs/day: 0.50    Years: 5.00    Pack years: 2.50    Types: Cigarettes    Quit date: 2018    Years since quitting: 4.8   Smokeless tobacco: Never  Vaping Use   Vaping Use: Never used  Substance and Sexual Activity   Alcohol use: Not Currently    Comment: occasional   Drug use: No   Sexual activity: Yes    Birth control/protection: Surgical  Other Topics Concern   Not on file  Social History Narrative   Not on file   Social Determinants of Health   Financial Resource Strain: Not on file  Food Insecurity: Not on file  Transportation Needs: Not on file  Physical Activity: Not on file  Stress: Not on file  Social Connections: Not on file  Intimate Partner Violence: Not on file    Outpatient Medications Prior to Visit  Medication Sig Dispense Refill   ALPRAZolam (XANAX) 0.25 MG tablet Take 1 tablet (0.25 mg total) by mouth 2 (two) times daily as needed for anxiety. 30 tablet 0  citalopram (CELEXA) 40 MG tablet Take 1 tablet (40 mg total) by mouth daily. 90 tablet 3   Insulin Pen Needle 32G X 6 MM MISC 1 each by Does not apply route daily. 90 each 0   Liraglutide -Weight Management (SAXENDA) 18 MG/3ML SOPN Inject 1.8 mg into the skin daily x 7 days. Then inject 2.4 mg into the skin daily on days 8-14. Then inject 3.0 mg into the skin daily thereafter. 15 mL 1   cephALEXin (KEFLEX) 500 MG capsule Take 1 capsule (500 mg total) by mouth 2 (two) times daily. 14 capsule 0   No facility-administered medications prior to visit.    No Known Allergies  ROS Review of Systems As per HPI.    Objective:    Physical Exam Vitals and nursing note reviewed.  Constitutional:      General: She is not in acute distress.    Appearance: She is obese. She is  not ill-appearing, toxic-appearing or diaphoretic.  Cardiovascular:     Rate and Rhythm: Normal rate and regular rhythm.     Heart sounds: Normal heart sounds. No murmur heard. Pulmonary:     Effort: Pulmonary effort is normal. No respiratory distress.     Breath sounds: Normal breath sounds.  Abdominal:     General: Bowel sounds are normal.     Palpations: Abdomen is soft.  Musculoskeletal:     Right lower leg: No edema.     Left lower leg: No edema.  Skin:    General: Skin is warm and dry.  Neurological:     General: No focal deficit present.     Mental Status: She is alert and oriented to person, place, and time.  Psychiatric:        Mood and Affect: Mood normal.        Behavior: Behavior normal.    BP 127/75   Pulse 84   Temp (!) 97.5 F (36.4 C) (Temporal)   Resp 20   Ht 5' 3" (1.6 m)   Wt 222 lb 4 oz (100.8 kg)   SpO2 95%   BMI 39.37 kg/m  Wt Readings from Last 3 Encounters:  08/23/21 222 lb 4 oz (100.8 kg)  07/26/21 221 lb 2 oz (100.3 kg)  06/06/21 218 lb 2 oz (98.9 kg)     Health Maintenance Due  Topic Date Due   PAP SMEAR-Modifier  02/11/2015    There are no preventive care reminders to display for this patient.  Lab Results  Component Value Date   TSH 1.490 06/06/2021   Lab Results  Component Value Date   WBC 9.2 06/06/2021   HGB 15.0 06/06/2021   HCT 41.4 06/06/2021   MCV 89 06/06/2021   PLT 302 06/06/2021   Lab Results  Component Value Date   NA 140 06/06/2021   K 3.9 06/06/2021   CO2 23 06/06/2021   GLUCOSE 116 (H) 06/06/2021   BUN 9 06/06/2021   CREATININE 0.79 06/06/2021   BILITOT 0.6 06/06/2021   ALKPHOS 132 (H) 06/06/2021   AST 18 06/06/2021   ALT 18 06/06/2021   PROT 6.8 06/06/2021   ALBUMIN 4.4 06/06/2021   CALCIUM 9.4 06/06/2021   EGFR 97 06/06/2021   Lab Results  Component Value Date   CHOL 189 06/06/2021   Lab Results  Component Value Date   HDL 41 06/06/2021   Lab Results  Component Value Date   LDLCALC 121  (H) 06/06/2021   Lab Results  Component Value Date     TRIG 154 (H) 06/06/2021   Lab Results  Component Value Date   CHOLHDL 4.6 (H) 06/06/2021   Lab Results  Component Value Date   HGBA1C 5.5 06/06/2021      Assessment & Plan:   Lindsay [name] was seen today for weight management.  Diagnoses and all orders for this visit:  Morbid obesity (HCC) Has gained 4 lbs since starting Saxenda. Has been on max dosage now for 4 week. Discussed need to improve diet and exercise along with saxenda for weight loss to occur. Discussed will try again for another 4 weeks, if no weight loss occurs, will consider this a failure.   Screening-pulmonary TB TB screening order. Will complete and fax employment form for patient pending results.  -     QuantiFERON-TB Gold Plus  Follow-up: Return in about 4 weeks (around 09/20/2021) for weight managment.  The patient indicates understanding of these issues and agrees with the plan.    Tiffany M Morgan, FNP 

## 2021-08-23 NOTE — Patient Instructions (Signed)

## 2021-08-29 LAB — QUANTIFERON-TB GOLD PLUS
QuantiFERON Mitogen Value: >10 IU/mL
QuantiFERON Nil Value: 0.02 IU/mL
QuantiFERON TB1 Ag Value: 0.03 IU/mL
QuantiFERON TB2 Ag Value: 0.05 IU/mL
QuantiFERON-TB Gold Plus: NEGATIVE

## 2021-09-04 ENCOUNTER — Encounter: Payer: Self-pay | Admitting: Family Medicine

## 2021-09-08 ENCOUNTER — Other Ambulatory Visit: Payer: Self-pay | Admitting: Family Medicine

## 2021-09-08 DIAGNOSIS — F411 Generalized anxiety disorder: Secondary | ICD-10-CM

## 2021-09-08 DIAGNOSIS — F331 Major depressive disorder, recurrent, moderate: Secondary | ICD-10-CM

## 2021-09-20 ENCOUNTER — Ambulatory Visit: Payer: 59 | Admitting: Family Medicine

## 2021-09-20 ENCOUNTER — Encounter: Payer: Self-pay | Admitting: Family Medicine

## 2021-10-15 ENCOUNTER — Ambulatory Visit (INDEPENDENT_AMBULATORY_CARE_PROVIDER_SITE_OTHER): Payer: Self-pay | Admitting: Family Medicine

## 2021-10-15 ENCOUNTER — Encounter: Payer: Self-pay | Admitting: Family Medicine

## 2021-10-15 DIAGNOSIS — R3989 Other symptoms and signs involving the genitourinary system: Secondary | ICD-10-CM

## 2021-10-15 LAB — URINALYSIS, ROUTINE W REFLEX MICROSCOPIC
Bilirubin, UA: NEGATIVE
Ketones, UA: NEGATIVE
Leukocytes,UA: NEGATIVE
Nitrite, UA: POSITIVE — AB
RBC, UA: NEGATIVE
Specific Gravity, UA: 1.015 (ref 1.005–1.030)
Urobilinogen, Ur: 1 mg/dL (ref 0.2–1.0)
pH, UA: 6 (ref 5.0–7.5)

## 2021-10-15 LAB — MICROSCOPIC EXAMINATION
Epithelial Cells (non renal): NONE SEEN /hpf (ref 0–10)
Renal Epithel, UA: NONE SEEN /hpf

## 2021-10-15 MED ORDER — NITROFURANTOIN MONOHYD MACRO 100 MG PO CAPS: 100.0000 mg | ORAL_CAPSULE | Freq: Two times a day (BID) | ORAL | 0 refills | Status: AC

## 2021-10-15 NOTE — Progress Notes (Signed)
° °  Virtual Visit via Telephone Note  I connected with Lindsay [name] on 10/15/21 at 8:35 AM by telephone and verified that I am speaking with the correct person using two identifiers. Lindsay [name] is currently located in her vehicle and nobody is currently with her during this visit. The provider, Loman Brooklyn, FNP is located in their home at time of visit.  I discussed the limitations, risks, security and privacy concerns of performing an evaluation and management service by telephone and the availability of in person appointments. I also discussed with the patient that there may be a patient responsible charge related to this service. The patient expressed understanding and agreed to proceed.  Subjective: PCP: Gwenlyn Perking, FNP  Chief Complaint  Patient presents with   Urinary Tract Infection   Patient complains of abnormal smelling urine, dysuria, frequency, and urgency.  She has had symptoms for 10 days. Patient denies fever. Patient does have a history of recurrent UTI.  Patient does not have a history of pyelonephritis. Patient has been taking AZO.   ROS: Per HPI  Current Outpatient Medications:    ALPRAZolam (XANAX) 0.25 MG tablet, Take 1 tablet (0.25 mg total) by mouth 2 (two) times daily as needed for anxiety., Disp: 30 tablet, Rfl: 0   citalopram (CELEXA) 40 MG tablet, Take 1 tablet (40 mg total) by mouth daily., Disp: 90 tablet, Rfl: 3   Insulin Pen Needle 32G X 6 MM MISC, 1 each by Does not apply route daily., Disp: 90 each, Rfl: 0   Liraglutide -Weight Management (SAXENDA) 18 MG/3ML SOPN, Inject 1.8 mg into the skin daily x 7 days. Then inject 2.4 mg into the skin daily on days 8-14. Then inject 3.0 mg into the skin daily thereafter., Disp: 15 mL, Rfl: 1  No Known Allergies Past Medical History:  Diagnosis Date   ADD (attention deficit disorder)    history   Anxiety    History of gestational diabetes     Observations/Objective: A&O  No respiratory distress  or wheezing audible over the phone Mood, judgement, and thought processes all WNL  Assessment and Plan: 1. Suspected UTI - Urinalysis, Routine w reflex microscopic; Future - Urine Culture; Future - nitrofurantoin, macrocrystal-monohydrate, (MACROBID) 100 MG capsule; Take 1 capsule (100 mg total) by mouth 2 (two) times daily for 5 days.  Dispense: 10 capsule; Refill: 0   Follow Up Instructions:  I discussed the assessment and treatment plan with the patient. The patient was provided an opportunity to ask questions and all were answered. The patient agreed with the plan and demonstrated an understanding of the instructions.   The patient was advised to call back or seek an in-person evaluation if the symptoms worsen or if the condition fails to improve as anticipated.  The above assessment and management plan was discussed with the patient. The patient verbalized understanding of and has agreed to the management plan. Patient is aware to call the clinic if symptoms persist or worsen. Patient is aware when to return to the clinic for a follow-up visit. Patient educated on when it is appropriate to go to the emergency department.   Time call ended: 8:46 AM  I provided 11 minutes of non-face-to-face time during this encounter.  Hendricks Limes, MSN, APRN, FNP-C [facility] 10/15/21

## 2021-10-18 LAB — URINE CULTURE

## 2021-10-24 ENCOUNTER — Other Ambulatory Visit: Payer: Self-pay | Admitting: Family Medicine

## 2021-10-24 DIAGNOSIS — F411 Generalized anxiety disorder: Secondary | ICD-10-CM

## 2021-10-24 DIAGNOSIS — F331 Major depressive disorder, recurrent, moderate: Secondary | ICD-10-CM

## 2021-10-28 ENCOUNTER — Telehealth: Payer: Self-pay | Admitting: *Deleted

## 2021-10-28 NOTE — Telephone Encounter (Signed)
Message from plan: Request Reference Number: [id number]. SAXENDA INJ 18MG /3ML is denied for not meeting the prior authorization requirement(s). Details of this decision are in the notice attached below or have been faxed to you.  The requested medication and/or diagnosis are not a covered benefit and excluded from coverage in accordance with the terms and conditions of your plan benefit. Therefore, the request has been administratively denied

## 2021-10-28 NOTE — Telephone Encounter (Signed)
Lindsay [name] (Key: [id number]) [name] 18MG Fayne Mediate pen-injectors   Form [employer] Electronic Prior Authorization Form (2017 NCPDP)  Sent to plan

## 2021-10-28 NOTE — Telephone Encounter (Signed)
Pt is no longer taking saxenda for the past month. It did not help.

## 2021-10-28 NOTE — Telephone Encounter (Signed)
Message sent to pt to confirm ins plan for 2023.

## 2021-11-29 ENCOUNTER — Encounter: Payer: Self-pay | Admitting: Family Medicine

## 2021-11-29 ENCOUNTER — Ambulatory Visit (INDEPENDENT_AMBULATORY_CARE_PROVIDER_SITE_OTHER): Payer: 59 | Admitting: Family Medicine

## 2021-11-29 DIAGNOSIS — N390 Urinary tract infection, site not specified: Secondary | ICD-10-CM

## 2021-11-29 DIAGNOSIS — R3989 Other symptoms and signs involving the genitourinary system: Secondary | ICD-10-CM

## 2021-11-29 MED ORDER — PHENAZOPYRIDINE HCL 100 MG PO TABS: 100.0000 mg | ORAL_TABLET | Freq: Three times a day (TID) | ORAL | 0 refills | Status: DC | PRN

## 2021-11-29 MED ORDER — CEPHALEXIN 500 MG PO CAPS: 500.0000 mg | ORAL_CAPSULE | Freq: Two times a day (BID) | ORAL | 0 refills | Status: AC

## 2021-11-29 NOTE — Progress Notes (Signed)
Virtual Visit via Telephone Note  I connected with Lindsay [name] on 11/29/21 at 4:33 PM by telephone and verified that I am speaking with the correct person using two identifiers. Lindsay [name] is currently located in her vehicle and her daughter is currently with her during this visit. The provider, Loman Brooklyn, FNP is located in their office at time of visit.  I discussed the limitations, risks, security and privacy concerns of performing an evaluation and management service by telephone and the availability of in person appointments. I also discussed with the patient that there may be a patient responsible charge related to this service. The patient expressed understanding and agreed to proceed.  Subjective: PCP: Gwenlyn Perking, FNP  Chief Complaint  Patient presents with   Urinary Tract Infection   Patient complains of dysuria and urgency. Symptoms just started today. Patient also complains of  pinkish color on toilet paper . Patient denies back pain and fever. Patient does have a history of recurrent UTI.  Patient does not have a history of pyelonephritis. This is her third UTI in the past six months.   ROS: Per HPI  Current Outpatient Medications:    ALPRAZolam (XANAX) 0.25 MG tablet, Take 1 tablet (0.25 mg total) by mouth 2 (two) times daily as needed for anxiety., Disp: 30 tablet, Rfl: 0   citalopram (CELEXA) 40 MG tablet, Take 1 tablet (40 mg total) by mouth daily., Disp: 90 tablet, Rfl: 3   Insulin Pen Needle 32G X 6 MM MISC, 1 each by Does not apply route daily., Disp: 90 each, Rfl: 0   Liraglutide -Weight Management (SAXENDA) 18 MG/3ML SOPN, Inject 1.8 mg into the skin daily x 7 days. Then inject 2.4 mg into the skin daily on days 8-14. Then inject 3.0 mg into the skin daily thereafter., Disp: 15 mL, Rfl: 1  No Known Allergies Past Medical History:  Diagnosis Date   ADD (attention deficit disorder)    history   Anxiety    History of gestational diabetes      Observations/Objective: A&O  No respiratory distress or wheezing audible over the phone Mood, judgement, and thought processes all WNL  Assessment and Plan: 1. Suspected UTI - cephALEXin (KEFLEX) 500 MG capsule; Take 1 capsule (500 mg total) by mouth 2 (two) times daily for 7 days.  Dispense: 14 capsule; Refill: 0 - phenazopyridine (PYRIDIUM) 100 MG tablet; Take 1 tablet (100 mg total) by mouth 3 (three) times daily as needed for pain.  Dispense: 10 tablet; Refill: 0  2. Recurrent UTI - Ambulatory referral to Urology   Follow Up Instructions:  I discussed the assessment and treatment plan with the patient. The patient was provided an opportunity to ask questions and all were answered. The patient agreed with the plan and demonstrated an understanding of the instructions.   The patient was advised to call back or seek an in-person evaluation if the symptoms worsen or if the condition fails to improve as anticipated.  The above assessment and management plan was discussed with the patient. The patient verbalized understanding of and has agreed to the management plan. Patient is aware to call the clinic if symptoms persist or worsen. Patient is aware when to return to the clinic for a follow-up visit. Patient educated on when it is appropriate to go to the emergency department.   Time call ended: 4:44 PM  I provided 11 minutes of non-face-to-face time during this encounter.  Hendricks Limes, MSN, APRN, FNP-C [facility] 11/29/21

## 2021-12-11 ENCOUNTER — Encounter: Payer: Self-pay | Admitting: *Deleted

## 2022-03-10 ENCOUNTER — Ambulatory Visit (INDEPENDENT_AMBULATORY_CARE_PROVIDER_SITE_OTHER): Payer: 59 | Admitting: Family Medicine

## 2022-03-10 ENCOUNTER — Encounter: Payer: Self-pay | Admitting: Family Medicine

## 2022-03-10 DIAGNOSIS — R3989 Other symptoms and signs involving the genitourinary system: Secondary | ICD-10-CM

## 2022-03-10 MED ORDER — CEPHALEXIN 500 MG PO CAPS: 500.0000 mg | ORAL_CAPSULE | Freq: Two times a day (BID) | ORAL | 0 refills | Status: DC

## 2022-03-10 NOTE — Progress Notes (Signed)
   Virtual Visit  Note Due to COVID-19 pandemic this visit was conducted virtually. This visit type was conducted due to national recommendations for restrictions regarding the COVID-19 Pandemic (e.g. social distancing, sheltering in place) in an effort to limit this patient's exposure and mitigate transmission in our community. All issues noted in this document were discussed and addressed.  A physical exam was not performed with this format.  I connected with Lindsay [name] on 03/10/22 at 1253 by telephone and verified that I am speaking with the correct person using two identifiers. Lindsay [name] is currently located at work and no one is currently with her during the visit. The provider, Gwenlyn Perking, FNP is located in their office at time of visit.  I discussed the limitations, risks, security and privacy concerns of performing an evaluation and management service by telephone and the availability of in person appointments. I also discussed with the patient that there may be a patient responsible charge related to this service. The patient expressed understanding and agreed to proceed.  CC: UTI  History and Present Illness:  HPI Lindsay [name] reports urgency, frequency, and dysuria x 4 days. Today her symptoms are worse with increase abdominal pressure. She also reports a urinary odor. She denies fever, chills, hematuria, nausea, or vomiting. She does have some lower back pain. She has a history of UTIs. She is out of town and is unable to bring a specimen for testing.     ROS As per HPI.   Observations/Objective: Alert and oriented x 3. Able to speak in full sentences without difficulty.    Assessment and Plan: Lindsay [name] was seen today for urinary tract infection.  Diagnoses and all orders for this visit:  Suspected UTI Will treat based on symptoms and history of UTI. Keflex as below.  -     cephALEXin (KEFLEX) 500 MG capsule; Take 1 capsule (500 mg total) by mouth 2 (two) times  daily.     Follow Up Instructions: Return to office for new or worsening symptoms, or if symptoms persist.   I discussed the assessment and treatment plan with the patient. The patient was provided an opportunity to ask questions and all were answered. The patient agreed with the plan and demonstrated an understanding of the instructions.   The patient was advised to call back or seek an in-person evaluation if the symptoms worsen or if the condition fails to improve as anticipated.  The above assessment and management plan was discussed with the patient. The patient verbalized understanding of and has agreed to the management plan. Patient is aware to call the clinic if symptoms persist or worsen. Patient is aware when to return to the clinic for a follow-up visit. Patient educated on when it is appropriate to go to the emergency department.   Time call ended:  1305  I provided 12 minutes of  non face-to-face time during this encounter.    Gwenlyn Perking, FNP

## 2022-04-23 ENCOUNTER — Other Ambulatory Visit: Payer: Self-pay | Admitting: Obstetrics and Gynecology

## 2022-04-23 DIAGNOSIS — R928 Other abnormal and inconclusive findings on diagnostic imaging of breast: Secondary | ICD-10-CM

## 2022-05-01 ENCOUNTER — Ambulatory Visit
Admission: RE | Admit: 2022-05-01 | Discharge: 2022-05-01 | Disposition: A | Discharge status: Home / Self Care | Payer: Self-pay | Source: Ambulatory Visit | Attending: Obstetrics and Gynecology | Admitting: Obstetrics and Gynecology

## 2022-05-01 ENCOUNTER — Other Ambulatory Visit: Payer: Self-pay | Admitting: Obstetrics and Gynecology

## 2022-05-01 ENCOUNTER — Ambulatory Visit
Admission: RE | Admit: 2022-05-01 | Discharge: 2022-05-01 | Disposition: A | Discharge status: Home / Self Care | Payer: 59 | Source: Ambulatory Visit | Attending: Obstetrics and Gynecology | Admitting: Obstetrics and Gynecology

## 2022-05-01 DIAGNOSIS — N6489 Other specified disorders of breast: Secondary | ICD-10-CM

## 2022-05-01 DIAGNOSIS — R928 Other abnormal and inconclusive findings on diagnostic imaging of breast: Secondary | ICD-10-CM

## 2022-06-12 ENCOUNTER — Other Ambulatory Visit: Payer: Self-pay | Admitting: Family Medicine

## 2022-06-12 DIAGNOSIS — F331 Major depressive disorder, recurrent, moderate: Secondary | ICD-10-CM

## 2022-06-12 DIAGNOSIS — F411 Generalized anxiety disorder: Secondary | ICD-10-CM

## 2022-06-13 NOTE — Telephone Encounter (Signed)
NTBS.

## 2022-06-13 NOTE — Telephone Encounter (Signed)
CALLED PT TO SCHEDULE APPT, DECLINED TO MAKE APPT STATED SHE WOULD JUST WAIT

## 2022-07-05 ENCOUNTER — Other Ambulatory Visit: Payer: Self-pay | Admitting: Family Medicine

## 2022-07-05 DIAGNOSIS — F331 Major depressive disorder, recurrent, moderate: Secondary | ICD-10-CM

## 2022-07-05 DIAGNOSIS — F411 Generalized anxiety disorder: Secondary | ICD-10-CM

## 2022-08-05 ENCOUNTER — Telehealth: Payer: 59 | Admitting: Family Medicine

## 2022-08-05 DIAGNOSIS — R3989 Other symptoms and signs involving the genitourinary system: Secondary | ICD-10-CM

## 2022-08-05 MED ORDER — NITROFURANTOIN MONOHYD MACRO 100 MG PO CAPS: 100.0000 mg | ORAL_CAPSULE | Freq: Two times a day (BID) | ORAL | 0 refills | Status: AC

## 2022-08-05 NOTE — Progress Notes (Signed)

## 2022-10-09 ENCOUNTER — Telehealth: Payer: Self-pay | Admitting: Family Medicine

## 2022-10-09 NOTE — Telephone Encounter (Signed)
This is fine with me   

## 2022-10-10 NOTE — Telephone Encounter (Signed)
Whilst, I would LOVE to accommodate her and may be able to in the future.  My schedule is booking out quite far with established patients.  If she is willing to wait for an opening and keep checking back, we hopefully can take her on in the future.  Right now, my panel is closed to all patients.

## 2022-10-10 NOTE — Telephone Encounter (Signed)
PT AWARE OF RECOMMENDATIONS

## 2022-11-03 ENCOUNTER — Other Ambulatory Visit: Payer: 59

## 2022-11-07 ENCOUNTER — Encounter: Payer: Self-pay | Admitting: Family Medicine

## 2022-11-07 ENCOUNTER — Ambulatory Visit (INDEPENDENT_AMBULATORY_CARE_PROVIDER_SITE_OTHER): Payer: 59 | Admitting: Family Medicine

## 2022-11-07 VITALS — BP 140/90 | HR 83 | Temp 98.1°F | Ht 63.0 in | Wt 225.0 lb

## 2022-11-07 DIAGNOSIS — Z79899 Other long term (current) drug therapy: Secondary | ICD-10-CM | POA: Insufficient documentation

## 2022-11-07 DIAGNOSIS — E1165 Type 2 diabetes mellitus with hyperglycemia: Secondary | ICD-10-CM | POA: Diagnosis not present

## 2022-11-07 DIAGNOSIS — R739 Hyperglycemia, unspecified: Secondary | ICD-10-CM | POA: Diagnosis not present

## 2022-11-07 DIAGNOSIS — F331 Major depressive disorder, recurrent, moderate: Secondary | ICD-10-CM | POA: Diagnosis not present

## 2022-11-07 DIAGNOSIS — F411 Generalized anxiety disorder: Secondary | ICD-10-CM | POA: Diagnosis not present

## 2022-11-07 LAB — BAYER DCA HB A1C WAIVED: HB A1C (BAYER DCA - WAIVED): 6.9 % — ABNORMAL HIGH (ref 4.8–5.6)

## 2022-11-07 LAB — LIPID PANEL

## 2022-11-07 MED ORDER — LANCET DEVICE MISC: 1.0000 | Freq: Three times a day (TID) | 0 refills | Status: AC

## 2022-11-07 MED ORDER — LANCETS MISC. MISC: 1.0000 | Freq: Three times a day (TID) | 0 refills | Status: AC

## 2022-11-07 MED ORDER — ALPRAZOLAM 0.25 MG PO TABS: 0.2500 mg | ORAL_TABLET | Freq: Two times a day (BID) | ORAL | 0 refills | Status: DC | PRN

## 2022-11-07 MED ORDER — CITALOPRAM HYDROBROMIDE 40 MG PO TABS: 40.0000 mg | ORAL_TABLET | Freq: Every day | ORAL | 0 refills | Status: DC

## 2022-11-07 MED ORDER — BLOOD GLUCOSE TEST VI STRP: 1.0000 | ORAL_STRIP | Freq: Three times a day (TID) | 0 refills | Status: DC

## 2022-11-07 MED ORDER — BLOOD GLUCOSE MONITORING SUPPL DEVI: 1.0000 | Freq: Three times a day (TID) | 0 refills | Status: AC

## 2022-11-07 MED ORDER — METFORMIN HCL 500 MG PO TABS: 500.0000 mg | ORAL_TABLET | Freq: Two times a day (BID) | ORAL | 0 refills | Status: DC

## 2022-11-07 NOTE — Progress Notes (Signed)
Established Patient Office Visit  Subjective   Patient ID: Lindsay [name], female    DOB: 05/13/1981  Age: 42 y.o. MRN: [id number]  Chief Complaint  Patient presents with   Hyperglycemia    Fasting wake up 160-180.  After meals 250-290.     HPI Abnormal blood sugars Lindsay [name] has been checking her blood sugars at home. She reports fasting blood sugars have been 160-80 and post prandials have been 250-290. She has been eating a regular diet and has not been exercising.   2. Anxiety/depression She has been out of her celexa for about 2 weeks now. She did feel lik this was beneficial when she was taking this. She has also been out xanax. She takes this very infrequently.      11/07/2022    8:49 AM 08/23/2021    9:11 AM 07/26/2021   10:12 AM  Depression screen PHQ 2/9  Decreased Interest 1 0 0  Down, Depressed, Hopeless 0 0 0  PHQ - 2 Score 1 0 0  Altered sleeping 2 1 0  Tired, decreased energy 2 1 0  Change in appetite 2 1 0  Feeling bad or failure about yourself  0 0 0  Trouble concentrating 1 0 0  Moving slowly or fidgety/restless 0 0 0  Suicidal thoughts 0 0 0  PHQ-9 Score 8 3 0  Difficult doing work/chores Somewhat difficult Not difficult at all Not difficult at all      11/07/2022    9:01 AM 08/23/2021    9:13 AM 07/26/2021   10:12 AM 06/06/2021    9:05 AM  GAD 7 : Generalized Anxiety Score  Nervous, Anxious, on Edge 2 0 1 2  Control/stop worrying 2 0 0 2  Worry too much - different things 2 0 0 2  Trouble relaxing 2 0 1 2  Restless 1 0 0 0  Easily annoyed or irritable '3 1 1 2  '$ Afraid - awful might happen 0 0 0 0  Total GAD 7 Score '12 1 3 10  '$ Anxiety Difficulty Not difficult at all Not difficult at all Somewhat difficult Somewhat difficult       ROS    Objective:     BP (!) 140/90   Pulse 83   Temp 98.1 F (36.7 C) (Temporal)   Ht '5\' 3"'$  (1.6 m)   Wt 225 lb (102.1 kg)   SpO2 94%   BMI 39.86 kg/m    Physical Exam Vitals and nursing note reviewed.   Constitutional:      General: She is not in acute distress.    Appearance: She is obese. She is not ill-appearing, toxic-appearing or diaphoretic.  Cardiovascular:     Rate and Rhythm: Normal rate and regular rhythm.     Heart sounds: Normal heart sounds. No murmur heard. Pulmonary:     Effort: Pulmonary effort is normal. No respiratory distress.     Breath sounds: Normal breath sounds.  Abdominal:     General: Bowel sounds are normal. There is no distension.     Palpations: Abdomen is soft.     Tenderness: There is no abdominal tenderness. There is no guarding or rebound.  Musculoskeletal:     Right lower leg: No edema.     Left lower leg: No edema.  Skin:    General: Skin is warm and dry.  Neurological:     General: No focal deficit present.     Mental Status: She is alert and oriented to person, place,  and time.  Psychiatric:        Mood and Affect: Mood normal.        Behavior: Behavior normal.      No results found for any visits on 11/07/22.    The 10-year ASCVD risk score (Arnett DK, et al., 2019) is: 1.1%    Assessment & Plan:   Lindsay [name] was seen today for hyperglycemia.  Diagnoses and all orders for this visit:  Elevated blood sugar A1c is 6.9 today. + -     Bayer DCA Hb A1c Waived  Type 2 diabetes mellitus with hyperglycemia, without long-term current use of insulin ([place]) New dx. A1c is 6.9, at goal of <7. Start metformin. Discussed ozempic as next step given obesity as well. Discussed diet and exercise. Will discuss statin and ACE at next visit. -     metFORMIN (GLUCOPHAGE) 500 MG tablet; Take 1 tablet (500 mg total) by mouth 2 (two) times daily with a meal. -     CBC with Differential/Platelet -     CMP14+EGFR -     Lipid panel -     Blood Glucose Monitoring Suppl DEVI; 1 each by Does not apply route in the morning, at noon, and at bedtime. May substitute to any manufacturer covered by patient's insurance. E11.9 -     Glucose Blood (BLOOD GLUCOSE TEST  STRIPS) STRP; 1 each by In Vitro route in the morning, at noon, and at bedtime. May substitute to any manufacturer covered by patient's insurance. E11.9 -     Lancet Device MISC; 1 each by Does not apply route in the morning, at noon, and at bedtime. May substitute to any manufacturer covered by patient's insurance. E11.9 -     Lancets Misc. MISC; 1 each by Does not apply route in the morning, at noon, and at bedtime. May substitute to any manufacturer covered by patient's insurance. E11.9 -     Microalbumin / creatinine urine ratio  Morbid obesity (HCC) BMI 39 with DM and depression. Diet and exercise.   Moderate recurrent major depression (HCC) Restart Celexa.  -     citalopram (CELEXA) 40 MG tablet; Take 1 tablet (40 mg total) by mouth daily.  Generalized anxiety disorder Restart Celexa. Xanax prn- uses infrequently. PDMP reviewed, no red flags. CSA and UDS today.  -     ALPRAZolam (XANAX) 0.25 MG tablet; Take 1 tablet (0.25 mg total) by mouth 2 (two) times daily as needed for anxiety. -     citalopram (CELEXA) 40 MG tablet; Take 1 tablet (40 mg total) by mouth daily. -     ToxASSURE Select 13 (MW), Urine  Controlled substance agreement signed   Return in about 3 months (around 02/05/2023) for chronic follow up.   The patient indicates understanding of these issues and agrees with the plan.  Gwenlyn Perking, FNP

## 2022-11-08 LAB — CBC WITH DIFFERENTIAL/PLATELET
Basophils Absolute: 0 10*3/uL (ref 0.0–0.2)
Basos: 0 %
EOS (ABSOLUTE): 0.2 10*3/uL (ref 0.0–0.4)
Eos: 3 %
Hematocrit: 44.5 % (ref 34.0–46.6)
Hemoglobin: 15.4 g/dL (ref 11.1–15.9)
Immature Grans (Abs): 0 10*3/uL (ref 0.0–0.1)
Immature Granulocytes: 0 %
Lymphocytes Absolute: 1.9 10*3/uL (ref 0.7–3.1)
Lymphs: 20 %
MCH: 30.6 pg (ref 26.6–33.0)
MCHC: 34.6 g/dL (ref 31.5–35.7)
MCV: 89 fL (ref 79–97)
Monocytes Absolute: 0.5 10*3/uL (ref 0.1–0.9)
Monocytes: 6 %
Neutrophils Absolute: 7 10*3/uL (ref 1.4–7.0)
Neutrophils: 71 %
Platelets: 302 10*3/uL (ref 150–450)
RBC: 5.03 x10E6/uL (ref 3.77–5.28)
RDW: 12.4 % (ref 11.7–15.4)
WBC: 9.8 10*3/uL (ref 3.4–10.8)

## 2022-11-08 LAB — CMP14+EGFR
ALT: 12 IU/L (ref 0–32)
AST: 12 IU/L (ref 0–40)
Albumin/Globulin Ratio: 1.5 (ref 1.2–2.2)
Albumin: 4.4 g/dL (ref 3.9–4.9)
Alkaline Phosphatase: 153 IU/L — ABNORMAL HIGH (ref 44–121)
BUN/Creatinine Ratio: 14 (ref 9–23)
BUN: 10 mg/dL (ref 6–24)
Bilirubin Total: 0.4 mg/dL (ref 0.0–1.2)
CO2: 20 mmol/L (ref 20–29)
Calcium: 9.7 mg/dL (ref 8.7–10.2)
Chloride: 97 mmol/L (ref 96–106)
Creatinine, Ser: 0.7 mg/dL (ref 0.57–1.00)
Globulin, Total: 3 g/dL (ref 1.5–4.5)
Glucose: 187 mg/dL — ABNORMAL HIGH (ref 70–99)
Potassium: 4.2 mmol/L (ref 3.5–5.2)
Sodium: 134 mmol/L (ref 134–144)
Total Protein: 7.4 g/dL (ref 6.0–8.5)
eGFR: 111 mL/min/{1.73_m2} (ref >59)

## 2022-11-08 LAB — LIPID PANEL
Chol/HDL Ratio: 4.3 ratio (ref 0.0–4.4)
Cholesterol, Total: 203 mg/dL — ABNORMAL HIGH (ref 100–199)
HDL: 47 mg/dL (ref >39)
LDL Chol Calc (NIH): 136 mg/dL — ABNORMAL HIGH (ref 0–99)
Triglycerides: 111 mg/dL (ref 0–149)
VLDL Cholesterol Cal: 20 mg/dL (ref 5–40)

## 2022-11-10 ENCOUNTER — Ambulatory Visit
Admission: RE | Admit: 2022-11-10 | Discharge: 2022-11-10 | Disposition: A | Discharge status: Home / Self Care | Payer: 59 | Source: Ambulatory Visit | Attending: Obstetrics and Gynecology | Admitting: Obstetrics and Gynecology

## 2022-11-10 ENCOUNTER — Ambulatory Visit: Admission: RE | Admit: 2022-11-10 | Payer: 59 | Source: Ambulatory Visit

## 2022-11-10 DIAGNOSIS — N6489 Other specified disorders of breast: Secondary | ICD-10-CM

## 2022-11-10 DIAGNOSIS — R922 Inconclusive mammogram: Secondary | ICD-10-CM | POA: Diagnosis not present

## 2022-11-11 LAB — MICROALBUMIN / CREATININE URINE RATIO
Creatinine, Urine: 33.4 mg/dL
Microalb/Creat Ratio: 17 mg/g creat (ref 0–29)
Microalbumin, Urine: 5.6 ug/mL

## 2022-11-12 LAB — TOXASSURE SELECT 13 (MW), URINE

## 2022-11-13 ENCOUNTER — Other Ambulatory Visit: Payer: Self-pay | Admitting: *Deleted

## 2022-11-13 MED ORDER — ROSUVASTATIN CALCIUM 10 MG PO TABS: 10.0000 mg | ORAL_TABLET | Freq: Every day | ORAL | 3 refills | Status: DC

## 2022-11-14 ENCOUNTER — Other Ambulatory Visit: Payer: Self-pay | Admitting: Family Medicine

## 2022-11-14 DIAGNOSIS — E1165 Type 2 diabetes mellitus with hyperglycemia: Secondary | ICD-10-CM

## 2022-11-17 ENCOUNTER — Other Ambulatory Visit: Payer: Self-pay | Admitting: Obstetrics and Gynecology

## 2022-11-17 DIAGNOSIS — N6489 Other specified disorders of breast: Secondary | ICD-10-CM

## 2022-11-24 ENCOUNTER — Encounter: Payer: Self-pay | Admitting: Family Medicine

## 2022-11-25 MED ORDER — ROSUVASTATIN CALCIUM 10 MG PO TABS
10.0000 mg | ORAL_TABLET | Freq: Every day | ORAL | 3 refills | Status: DC
  Filled 2023-01-14: qty 90, 90d supply, fill #0
  Filled 2023-06-05: qty 90, 90d supply, fill #1
  Filled 2023-10-27: qty 90, 90d supply, fill #2

## 2022-11-29 ENCOUNTER — Other Ambulatory Visit: Payer: Self-pay | Admitting: Family Medicine

## 2022-11-29 DIAGNOSIS — F411 Generalized anxiety disorder: Secondary | ICD-10-CM

## 2022-11-29 DIAGNOSIS — F331 Major depressive disorder, recurrent, moderate: Secondary | ICD-10-CM

## 2022-12-04 ENCOUNTER — Other Ambulatory Visit: Payer: Self-pay | Admitting: Family Medicine

## 2022-12-04 DIAGNOSIS — E1165 Type 2 diabetes mellitus with hyperglycemia: Secondary | ICD-10-CM

## 2022-12-14 ENCOUNTER — Encounter: Payer: Self-pay | Admitting: Family Medicine

## 2022-12-17 ENCOUNTER — Other Ambulatory Visit: Payer: Self-pay | Admitting: Family Medicine

## 2022-12-17 DIAGNOSIS — E1165 Type 2 diabetes mellitus with hyperglycemia: Secondary | ICD-10-CM

## 2022-12-17 MED ORDER — SEMAGLUTIDE(0.25 OR 0.5MG/DOS) 2 MG/3ML ~~LOC~~ SOPN
PEN_INJECTOR | SUBCUTANEOUS | 3 refills | Status: DC
  Filled 2023-01-20: qty 3, fill #0
  Filled 2023-01-22: qty 3, 28d supply, fill #0

## 2023-01-13 ENCOUNTER — Other Ambulatory Visit (HOSPITAL_COMMUNITY): Payer: Self-pay

## 2023-01-14 ENCOUNTER — Other Ambulatory Visit (HOSPITAL_COMMUNITY): Payer: Self-pay

## 2023-01-14 MED FILL — Citalopram Hydrobromide Tab 40 MG (Base Equiv): ORAL | 30 days supply | Qty: 30 | Fill #0 | Status: AC

## 2023-01-20 ENCOUNTER — Other Ambulatory Visit (HOSPITAL_COMMUNITY): Payer: Self-pay

## 2023-01-20 ENCOUNTER — Other Ambulatory Visit: Payer: Self-pay

## 2023-01-22 ENCOUNTER — Other Ambulatory Visit (HOSPITAL_COMMUNITY): Payer: Self-pay

## 2023-01-22 ENCOUNTER — Other Ambulatory Visit: Payer: Self-pay

## 2023-02-05 ENCOUNTER — Ambulatory Visit (INDEPENDENT_AMBULATORY_CARE_PROVIDER_SITE_OTHER): Payer: 59 | Admitting: Family Medicine

## 2023-02-05 ENCOUNTER — Other Ambulatory Visit (HOSPITAL_COMMUNITY): Payer: Self-pay

## 2023-02-05 ENCOUNTER — Other Ambulatory Visit: Payer: Self-pay

## 2023-02-05 VITALS — BP 117/69 | HR 69 | Temp 98.1°F | Ht 63.0 in | Wt 222.0 lb

## 2023-02-05 DIAGNOSIS — E1169 Type 2 diabetes mellitus with other specified complication: Secondary | ICD-10-CM | POA: Diagnosis not present

## 2023-02-05 DIAGNOSIS — E1165 Type 2 diabetes mellitus with hyperglycemia: Secondary | ICD-10-CM

## 2023-02-05 DIAGNOSIS — F331 Major depressive disorder, recurrent, moderate: Secondary | ICD-10-CM | POA: Diagnosis not present

## 2023-02-05 DIAGNOSIS — E785 Hyperlipidemia, unspecified: Secondary | ICD-10-CM | POA: Diagnosis not present

## 2023-02-05 DIAGNOSIS — F411 Generalized anxiety disorder: Secondary | ICD-10-CM | POA: Diagnosis not present

## 2023-02-05 LAB — CMP14+EGFR
ALT: 15 IU/L (ref 0–32)
AST: 12 IU/L (ref 0–40)
Albumin/Globulin Ratio: 1.8 (ref 1.2–2.2)
Albumin: 4.2 g/dL (ref 3.9–4.9)
Alkaline Phosphatase: 140 IU/L — ABNORMAL HIGH (ref 44–121)
BUN/Creatinine Ratio: 15 (ref 9–23)
BUN: 10 mg/dL (ref 6–24)
Bilirubin Total: 0.5 mg/dL (ref 0.0–1.2)
CO2: 24 mmol/L (ref 20–29)
Calcium: 9.5 mg/dL (ref 8.7–10.2)
Chloride: 101 mmol/L (ref 96–106)
Creatinine, Ser: 0.68 mg/dL (ref 0.57–1.00)
Globulin, Total: 2.4 g/dL (ref 1.5–4.5)
Glucose: 135 mg/dL — ABNORMAL HIGH (ref 70–99)
Potassium: 4 mmol/L (ref 3.5–5.2)
Sodium: 140 mmol/L (ref 134–144)
Total Protein: 6.6 g/dL (ref 6.0–8.5)
eGFR: 111 mL/min/{1.73_m2} (ref >59)

## 2023-02-05 LAB — BAYER DCA HB A1C WAIVED: HB A1C (BAYER DCA - WAIVED): 6 % — ABNORMAL HIGH (ref 4.8–5.6)

## 2023-02-05 MED ORDER — SEMAGLUTIDE (1 MG/DOSE) 4 MG/3ML ~~LOC~~ SOPN
1.0000 mg | PEN_INJECTOR | SUBCUTANEOUS | 3 refills | Status: DC
  Filled 2023-02-05 – 2023-02-10 (×2): qty 9, 84d supply, fill #0
  Filled 2023-02-14: qty 3, 28d supply, fill #0
  Filled 2023-03-11: qty 3, 28d supply, fill #1
  Filled 2023-04-08: qty 3, 28d supply, fill #2
  Filled 2023-05-06: qty 3, 28d supply, fill #3
  Filled 2023-06-05: qty 3, 28d supply, fill #4
  Filled 2023-07-03: qty 3, 28d supply, fill #5
  Filled 2023-08-02: qty 3, 28d supply, fill #6
  Filled 2023-08-27: qty 3, 28d supply, fill #7
  Filled 2023-09-23: qty 3, 28d supply, fill #8
  Filled 2023-10-27: qty 3, 28d supply, fill #9
  Filled 2023-11-25: qty 3, 28d supply, fill #10
  Filled 2023-12-22: qty 3, 28d supply, fill #11

## 2023-02-05 NOTE — Progress Notes (Signed)
Established Patient Office Visit  Subjective   Patient ID: Lindsay [name], female    DOB: [birth year]  Age: 42 y.o. MRN: [id number]  Chief Complaint  Patient presents with   Medical Management of Chronic Issues   Diabetes    HPI  T2DM Pt presents for follow up evaluation of Type 2 diabetes mellitus. Patient denies hypoglycemia , increased appetite, nausea, paresthesia of the feet, polydipsia, polyuria, visual disturbances, vomiting, and weight loss.  Current diabetic medications include ozempic 0.5 mg weekly Compliant with meds - Yes  Current monitoring regimen: home blood tests - daily Home blood sugar records: fasting range: <120 Any episodes of hypoglycemia? no  Is She on ACE inhibitor or angiotensin II receptor blocker?  No, declines Is She on statin? Yes crestor   2. Obesity Reports diet could be better. She is not exercising but is very active at work.   3. Anxiety/depression Reports well controlled with celexa.      02/05/2023    8:40 AM 11/07/2022    8:49 AM 08/23/2021    9:11 AM  Depression screen PHQ 2/9  Decreased Interest 0 1 0  Down, Depressed, Hopeless 0 0 0  PHQ - 2 Score 0 1 0  Altered sleeping 0 2 1  Tired, decreased energy 0 2 1  Change in appetite 0 2 1  Feeling bad or failure about yourself  0 0 0  Trouble concentrating 2 1 0  Moving slowly or fidgety/restless 0 0 0  Suicidal thoughts 0 0 0  PHQ-9 Score 2 8 3   Difficult doing work/chores Somewhat difficult Somewhat difficult Not difficult at all      02/05/2023    8:40 AM 11/07/2022    9:01 AM 08/23/2021    9:13 AM 07/26/2021   10:12 AM  GAD 7 : Generalized Anxiety Score  Nervous, Anxious, on Edge 1 2 0 1  Control/stop worrying 0 2 0 0  Worry too much - different things 0 2 0 0  Trouble relaxing 0 2 0 1  Restless 0 1 0 0  Easily annoyed or irritable 1 3 1 1   Afraid - awful might happen 0 0 0 0  Total GAD 7 Score 2 12 1 3   Anxiety Difficulty Somewhat difficult Not difficult at all  Not difficult at all Somewhat difficult        ROS As per HPI.   Objective:     BP 117/69   Pulse 69   Temp 98.1 F (36.7 C) (Temporal)   Ht 5\' 3"  (1.6 m)   Wt 222 lb (100.7 kg)   SpO2 96%   BMI 39.33 kg/m  Wt Readings from Last 3 Encounters:  02/05/23 222 lb (100.7 kg)  11/07/22 225 lb (102.1 kg)  08/23/21 222 lb 4 oz (100.8 kg)      Physical Exam Vitals and nursing note reviewed.  Constitutional:      General: She is not in acute distress.    Appearance: Normal appearance. She is not ill-appearing, toxic-appearing or diaphoretic.  Cardiovascular:     Rate and Rhythm: Normal rate and regular rhythm.     Heart sounds: Normal heart sounds. No murmur heard. Pulmonary:     Effort: Pulmonary effort is normal. No respiratory distress.     Breath sounds: Normal breath sounds.  Abdominal:     General: Bowel sounds are normal. There is no distension.     Palpations: Abdomen is soft.     Tenderness: There is no abdominal  tenderness. There is no guarding or rebound.  Musculoskeletal:     Right lower leg: No edema.     Left lower leg: No edema.  Skin:    General: Skin is warm and dry.  Neurological:     General: No focal deficit present.     Mental Status: She is alert and oriented to person, place, and time.  Psychiatric:        Mood and Affect: Mood normal.        Behavior: Behavior normal.      No results found for any visits on 02/05/23.    The 10-year ASCVD risk score (Arnett DK, et al., 2019) is: 1.4%    Assessment & Plan:   Etna was seen today for medical management of chronic issues and diabetes.  Diagnoses and all orders for this visit:  Type 2 diabetes mellitus with hyperglycemia, without long-term current use of insulin (HCC) A1c 6.0 today, at goal of <7. Medication changes today: increase ozempic to assist with weight loss and maintain a1c. She declined ACE/ARB and she is on a statin. Eye exam: reminded to schedule. Foot exam: will do at  next visit. Urine micro: UTD. Diet and exercise.  -     CMP14+EGFR -     Bayer DCA Hb A1c Waived -     Semaglutide, 1 MG/DOSE, 4 MG/3ML SOPN; Inject 1 mg as directed once a week.  HLD  Fasting lipid panel pending after start crestor at last visit.   Morbid obesity (HCC) Down 3 lb since last visit. Diet and exercise. BMI 39 with DM and HLD.   Moderate recurrent major depression (HCC) Generalized anxiety disorder Well controlled on current regimen. Continue celexa.   Return in about 3 months (around 05/08/2023) for chronic follow up, foot exam.   The patient indicates understanding of these issues and agrees with the plan.  Gabriel Earing, FNP

## 2023-02-10 ENCOUNTER — Other Ambulatory Visit (HOSPITAL_COMMUNITY): Payer: Self-pay

## 2023-02-10 LAB — LIPID PANEL W/O CHOL/HDL RATIO
Cholesterol, Total: 141 mg/dL (ref 100–199)
HDL: 40 mg/dL (ref >39)
LDL Chol Calc (NIH): 77 mg/dL (ref 0–99)
Triglycerides: 136 mg/dL (ref 0–149)
VLDL Cholesterol Cal: 24 mg/dL (ref 5–40)

## 2023-02-10 LAB — SPECIMEN STATUS REPORT

## 2023-02-14 ENCOUNTER — Other Ambulatory Visit (HOSPITAL_COMMUNITY): Payer: Self-pay

## 2023-02-16 ENCOUNTER — Telehealth: Payer: 59 | Admitting: Physician Assistant

## 2023-02-16 ENCOUNTER — Other Ambulatory Visit: Payer: Self-pay

## 2023-02-16 DIAGNOSIS — R3989 Other symptoms and signs involving the genitourinary system: Secondary | ICD-10-CM | POA: Diagnosis not present

## 2023-02-16 MED ORDER — NITROFURANTOIN MONOHYD MACRO 100 MG PO CAPS: 100.0000 mg | ORAL_CAPSULE | Freq: Two times a day (BID) | ORAL | 0 refills | Status: DC

## 2023-02-16 NOTE — Progress Notes (Signed)
E-Visit for Urinary Problems  We are sorry that you are not feeling well.  Here is how we plan to help!  Based on what you shared with me it looks like you most likely have a simple urinary tract infection.  A UTI (Urinary Tract Infection) is a bacterial infection of the bladder.  Most cases of urinary tract infections are simple to treat but a key part of your care is to encourage you to drink plenty of fluids and watch your symptoms carefully.  I have prescribed MacroBid 100 mg twice a day for 5 days.  Your symptoms should gradually improve. Call us if the burning in your urine worsens, you develop worsening fever, back pain or pelvic pain or if your symptoms do not resolve after completing the antibiotic.  Urinary tract infections can be prevented by drinking plenty of water to keep your body hydrated.  Also be sure when you wipe, wipe from front to back and don't hold it in!  If possible, empty your bladder every 4 hours.  HOME CARE Drink plenty of fluids Compete the full course of the antibiotics even if the symptoms resolve Remember, when you need to go.go. Holding in your urine can increase the likelihood of getting a UTI! GET HELP RIGHT AWAY IF: You cannot urinate You get a high fever Worsening back pain occurs You see blood in your urine You feel sick to your stomach or throw up You feel like you are going to pass out  MAKE SURE YOU  Understand these instructions. Will watch your condition. Will get help right away if you are not doing well or get worse.   Thank you for choosing an e-visit.  Your e-visit answers were reviewed by a board certified advanced clinical practitioner to complete your personal care plan. Depending upon the condition, your plan could have included both over the counter or prescription medications.  Please review your pharmacy choice. Make sure the pharmacy is open so you can pick up prescription now. If there is a problem, you may contact your  provider through MyChart messaging and have the prescription routed to another pharmacy.  Your safety is important to us. If you have drug allergies check your prescription carefully.   For the next 24 hours you can use MyChart to ask questions about today's visit, request a non-urgent call back, or ask for a work or school excuse. You will get an email in the next two days asking about your experience. I hope that your e-visit has been valuable and will speed your recovery.  I have spent 5 minutes in review of e-visit questionnaire, review and updating patient chart, medical decision making and response to patient.   Pardeep Pautz M Sharonna Vinje, PA-C  

## 2023-02-19 ENCOUNTER — Other Ambulatory Visit (HOSPITAL_COMMUNITY): Payer: Self-pay

## 2023-03-11 ENCOUNTER — Other Ambulatory Visit: Payer: Self-pay

## 2023-03-11 ENCOUNTER — Other Ambulatory Visit (HOSPITAL_BASED_OUTPATIENT_CLINIC_OR_DEPARTMENT_OTHER): Payer: Self-pay

## 2023-04-06 ENCOUNTER — Encounter: Payer: Self-pay | Admitting: Family Medicine

## 2023-04-06 ENCOUNTER — Telehealth: Payer: 59 | Admitting: Family Medicine

## 2023-04-06 ENCOUNTER — Telehealth (INDEPENDENT_AMBULATORY_CARE_PROVIDER_SITE_OTHER): Payer: 59 | Admitting: Family Medicine

## 2023-04-06 DIAGNOSIS — R300 Dysuria: Secondary | ICD-10-CM

## 2023-04-06 DIAGNOSIS — R399 Unspecified symptoms and signs involving the genitourinary system: Secondary | ICD-10-CM

## 2023-04-06 LAB — URINALYSIS, COMPLETE
Bilirubin, UA: NEGATIVE
Ketones, UA: NEGATIVE
Nitrite, UA: POSITIVE — AB
Specific Gravity, UA: 1.01 (ref 1.005–1.030)
Urobilinogen, Ur: 1 mg/dL (ref 0.2–1.0)
pH, UA: 6 (ref 5.0–7.5)

## 2023-04-06 LAB — MICROSCOPIC EXAMINATION
Epithelial Cells (non renal): NONE SEEN /hpf (ref 0–10)
RBC, Urine: >30 /hpf — AB (ref 0–2)
Renal Epithel, UA: NONE SEEN /hpf
WBC, UA: >30 /hpf — AB (ref 0–5)

## 2023-04-06 MED ORDER — SULFAMETHOXAZOLE-TRIMETHOPRIM 800-160 MG PO TABS: 1.0000 | ORAL_TABLET | Freq: Two times a day (BID) | ORAL | 0 refills | Status: DC

## 2023-04-06 NOTE — Progress Notes (Signed)
Because recurrent UTI within 60 days of last treatment, your condition warrants further evaluation with a urine collection and I recommend that you be seen in a face to face visit.   NOTE: There will be NO CHARGE for this eVisit   If you are having a true medical emergency please call 911.

## 2023-04-06 NOTE — Progress Notes (Signed)
Virtual Visit via MyChart video note  I connected with Lindsay [name] on 04/06/23 at 1717 by video and verified that I am speaking with the correct person using two identifiers. Lindsay [name] is currently located at home and patient are currently with her during visit. The provider, Elige Radon Tayja Manzer, MD is located in their office at time of visit.  Call ended at 1722  I discussed the limitations, risks, security and privacy concerns of performing an evaluation and management service by video and the availability of in person appointments. I also discussed with the patient that there may be a patient responsible charge related to this service. The patient expressed understanding and agreed to proceed.   History and Present Illness:  1. UTI symptoms   1 day of urgency and dysuria.  She took azo and it helped.  She has had utis multiple times before. She denies fevers or chills.  She does have some lower back pain. She is trying hydration increase.  Got back from beach last week.   Outpatient Encounter Medications as of 04/06/2023  Medication Sig   sulfamethoxazole-trimethoprim (BACTRIM DS) 800-160 MG tablet Take 1 tablet by mouth 2 (two) times daily.   ALPRAZolam (XANAX) 0.25 MG tablet Take 1 tablet (0.25 mg total) by mouth 2 (two) times daily as needed for anxiety.   Blood Glucose Monitoring Suppl DEVI 1 each by Does not apply route in the morning, at noon, and at bedtime. May substitute to any manufacturer covered by patient's insurance. E11.9   citalopram (CELEXA) 40 MG tablet Take 1 tablet (40 mg total) by mouth daily.   glucose blood (FREESTYLE LITE) test strip USE IN THE MORNING, AT NOON, AND AT BEDTIME. E11.9   rosuvastatin (CRESTOR) 10 MG tablet Take 1 tablet (10 mg total) by mouth daily.   Semaglutide, 1 MG/DOSE, 4 MG/3ML SOPN Inject 1 mg as directed once a week.   [DISCONTINUED] nitrofurantoin, macrocrystal-monohydrate, (MACROBID) 100 MG capsule Take 1 capsule (100  mg total) by mouth 2 (two) times daily.   No facility-administered encounter medications on file as of 04/06/2023.    Review of Systems  Constitutional:  Negative for chills and fever.  Eyes:  Negative for visual disturbance.  Respiratory:  Negative for chest tightness and shortness of breath.   Cardiovascular:  Negative for chest pain and leg swelling.  Gastrointestinal:  Negative for abdominal pain.  Genitourinary:  Positive for dysuria, frequency and urgency. Negative for difficulty urinating, hematuria, vaginal bleeding, vaginal discharge and vaginal pain.  Musculoskeletal:  Negative for back pain and gait problem.  Skin:  Negative for rash.  Neurological:  Negative for light-headedness and headaches.  Psychiatric/Behavioral:  Negative for agitation and behavioral problems.   All other systems reviewed and are negative.  Urinalysis: Greater than 30 RBCs, greater than 30 WBCs, many bacteria, yeast present, nitrite positive, 3+ leukocytes and 3+ blood and trace glucose Observations/Objective: Patient sounds comfortable and in no acute distress  Assessment and Plan: Problem List Items Addressed This Visit   None Visit Diagnoses     UTI symptoms    -  Primary   Relevant Medications   sulfamethoxazole-trimethoprim (BACTRIM DS) 800-160 MG tablet   Other Relevant Orders   Urine Culture   Urinalysis, Complete (Completed)       Will treat with Bactrim, will await culture to see if we need to change it.  Encouraged hydration and she can continue to use the Azo as well. Follow up plan: Return if symptoms  worsen or fail to improve.     I discussed the assessment and treatment plan with the patient. The patient was provided an opportunity to ask questions and all were answered. The patient agreed with the plan and demonstrated an understanding of the instructions.   The patient was advised to call back or seek an in-person evaluation if the symptoms worsen or if the condition fails  to improve as anticipated.  The above assessment and management plan was discussed with the patient. The patient verbalized understanding of and has agreed to the management plan. Patient is aware to call the clinic if symptoms persist or worsen. Patient is aware when to return to the clinic for a follow-up visit. Patient educated on when it is appropriate to go to the emergency department.    I provided 5 minutes of non-face-to-face time during this encounter.    Nils Pyle, MD

## 2023-04-08 ENCOUNTER — Other Ambulatory Visit: Payer: Self-pay

## 2023-04-08 LAB — URINE CULTURE: Organism ID, Bacteria: NO GROWTH

## 2023-04-14 ENCOUNTER — Other Ambulatory Visit: Payer: Self-pay

## 2023-04-14 ENCOUNTER — Encounter: Payer: Self-pay | Admitting: Pharmacist

## 2023-04-15 ENCOUNTER — Other Ambulatory Visit: Payer: Self-pay

## 2023-05-07 ENCOUNTER — Other Ambulatory Visit: Payer: Self-pay

## 2023-05-08 ENCOUNTER — Encounter: Payer: Self-pay | Admitting: Family Medicine

## 2023-05-08 ENCOUNTER — Ambulatory Visit: Payer: 59 | Admitting: Family Medicine

## 2023-05-08 VITALS — BP 114/71 | HR 83 | Temp 97.9°F | Ht 63.0 in | Wt 216.2 lb

## 2023-05-08 DIAGNOSIS — E785 Hyperlipidemia, unspecified: Secondary | ICD-10-CM | POA: Diagnosis not present

## 2023-05-08 DIAGNOSIS — R945 Abnormal results of liver function studies: Secondary | ICD-10-CM | POA: Diagnosis not present

## 2023-05-08 DIAGNOSIS — F331 Major depressive disorder, recurrent, moderate: Secondary | ICD-10-CM | POA: Diagnosis not present

## 2023-05-08 DIAGNOSIS — E1169 Type 2 diabetes mellitus with other specified complication: Secondary | ICD-10-CM

## 2023-05-08 DIAGNOSIS — E1165 Type 2 diabetes mellitus with hyperglycemia: Secondary | ICD-10-CM | POA: Diagnosis not present

## 2023-05-08 DIAGNOSIS — F411 Generalized anxiety disorder: Secondary | ICD-10-CM | POA: Diagnosis not present

## 2023-05-08 LAB — BAYER DCA HB A1C WAIVED: HB A1C (BAYER DCA - WAIVED): 5.8 % — ABNORMAL HIGH (ref 4.8–5.6)

## 2023-05-08 NOTE — Progress Notes (Signed)
Established Patient Office Visit  Subjective   Patient ID: Lindsay [name], female    DOB: 11-Oct-1980  Age: 42 y.o. MRN: [id number]  Chief Complaint  Patient presents with   Medical Management of Chronic Issues   Diabetes    HPI  T2DM Pt presents for follow up evaluation of Type 2 diabetes mellitus. Patient denies hypoglycemia , increased appetite, nausea, paresthesia of the feet, polydipsia, polyuria, visual disturbances, vomiting, and weight loss.  Current diabetic medications include ozempic 0.5 mg weekly Compliant with meds - Yes  Current monitoring regimen: home blood tests - occasionally Home blood sugar records: fasting range: <120 Any episodes of hypoglycemia? Sometimes, can feel it. Usually occurs after high carb meal  Is She on ACE inhibitor or angiotensin II receptor blocker?  No, declines Is She on statin? Yes crestor   2. Obesity Reports diets is up and down. She is not exercising but is active at work.   3. Anxiety/depression Reports well controlled with celexa. No side effects     05/08/2023   11:03 AM 02/05/2023    8:40 AM 11/07/2022    8:49 AM  Depression screen PHQ 2/9  Decreased Interest 0 0 1  Down, Depressed, Hopeless 0 0 0  PHQ - 2 Score 0 0 1  Altered sleeping 2 0 2  Tired, decreased energy 0 0 2  Change in appetite 1 0 2  Feeling bad or failure about yourself  0 0 0  Trouble concentrating 0 2 1  Moving slowly or fidgety/restless 0 0 0  Suicidal thoughts 0 0 0  PHQ-9 Score 3 2 8   Difficult doing work/chores Not difficult at all Somewhat difficult Somewhat difficult      05/08/2023   11:02 AM 02/05/2023    8:40 AM 11/07/2022    9:01 AM 08/23/2021    9:13 AM  GAD 7 : Generalized Anxiety Score  Nervous, Anxious, on Edge 0 1 2 0  Control/stop worrying 0 0 2 0  Worry too much - different things 0 0 2 0  Trouble relaxing 0 0 2 0  Restless 0 0 1 0  Easily annoyed or irritable 0 1 3 1   Afraid - awful might happen 0 0 0 0  Total GAD 7 Score  0 2 12 1   Anxiety Difficulty Not difficult at all Somewhat difficult Not difficult at all Not difficult at all        ROS As per HPI.   Objective:     BP 114/71   Pulse 83   Temp 97.9 F (36.6 C) (Temporal)   Ht 5\' 3"  (1.6 m)   Wt 216 lb 4 oz (98.1 kg)   SpO2 95%   BMI 38.31 kg/m  Wt Readings from Last 3 Encounters:  05/08/23 216 lb 4 oz (98.1 kg)  02/05/23 222 lb (100.7 kg)  11/07/22 225 lb (102.1 kg)      Physical Exam Vitals and nursing note reviewed.  Constitutional:      General: She is not in acute distress.    Appearance: She is obese. She is not ill-appearing, toxic-appearing or diaphoretic.  Neck:     Thyroid: No thyroid mass, thyromegaly or thyroid tenderness.  Cardiovascular:     Rate and Rhythm: Normal rate and regular rhythm.     Heart sounds: Normal heart sounds. No murmur heard. Pulmonary:     Effort: Pulmonary effort is normal. No respiratory distress.     Breath sounds: Normal breath sounds.  Abdominal:  General: Bowel sounds are normal. There is no distension.     Palpations: Abdomen is soft.     Tenderness: There is no abdominal tenderness. There is no guarding or rebound.  Musculoskeletal:     Cervical back: Neck supple. No rigidity.     Right lower leg: No edema.     Left lower leg: No edema.  Skin:    General: Skin is warm and dry.  Neurological:     General: No focal deficit present.     Mental Status: She is alert and oriented to person, place, and time.  Psychiatric:        Mood and Affect: Mood normal.        Behavior: Behavior normal.    Diabetic Foot Exam - Simple   Simple Foot Form Diabetic Foot exam was performed with the following findings: Yes 05/08/2023 11:26 AM  Visual Inspection No deformities, no ulcerations, no other skin breakdown bilaterally: Yes Sensation Testing Intact to touch and monofilament testing bilaterally: Yes Pulse Check Posterior Tibialis and Dorsalis pulse intact bilaterally: Yes Comments       No results found for any visits on 05/08/23.    The 10-year ASCVD risk score (Arnett DK, et al., 2019) is: 0.9%    Assessment & Plan:   Lindsay [name] was seen today for medical management of chronic issues and diabetes.  Diagnoses and all orders for this visit:  Type 2 diabetes mellitus with hyperglycemia, without long-term current use of insulin (HCC) A1c 5.8 today, at goal of <7. Medication changes today: none. Continue ozempic. Declined ACE/ARB. She is on a statin. Eye exam: reminded to schedule eye exam. Foot exam: today. Urine micro: UTD. Diet and exercise.  -     CMP14+EGFR -     Bayer DCA Hb A1c Waived  Hyperlipidemia associated with type 2 diabetes mellitus (HCC) Last LDL at 77. Continue statin. Diet and exercise.   Morbid obesity (HCC) Weight trending down. BMI 38 with DM, HLD. Diet and exercise.   Moderate recurrent major depression (HCC) Generalized anxiety disorder Well controlled on current regimen.    Return in about 6 months (around 11/08/2023) for CPE.   The patient indicates understanding of these issues and agrees with the plan.  Gabriel Earing, FNP

## 2023-05-12 LAB — SPECIMEN STATUS REPORT

## 2023-05-12 LAB — ALKALINE PHOSPHATASE, ISOENZYMES

## 2023-06-05 ENCOUNTER — Other Ambulatory Visit (HOSPITAL_COMMUNITY): Payer: Self-pay

## 2023-06-05 ENCOUNTER — Other Ambulatory Visit: Payer: Self-pay

## 2023-06-05 ENCOUNTER — Other Ambulatory Visit: Payer: Self-pay | Admitting: Family Medicine

## 2023-06-05 DIAGNOSIS — F331 Major depressive disorder, recurrent, moderate: Secondary | ICD-10-CM

## 2023-06-05 DIAGNOSIS — F411 Generalized anxiety disorder: Secondary | ICD-10-CM

## 2023-06-05 MED ORDER — CITALOPRAM HYDROBROMIDE 40 MG PO TABS
40.0000 mg | ORAL_TABLET | Freq: Every day | ORAL | 1 refills | Status: DC
  Filled 2023-06-05: qty 90, 90d supply, fill #0
  Filled 2023-10-27: qty 90, 90d supply, fill #1

## 2023-07-03 ENCOUNTER — Other Ambulatory Visit (HOSPITAL_COMMUNITY): Payer: Self-pay

## 2023-07-03 ENCOUNTER — Other Ambulatory Visit: Payer: Self-pay

## 2023-07-21 ENCOUNTER — Other Ambulatory Visit (HOSPITAL_COMMUNITY): Payer: Self-pay

## 2023-08-03 ENCOUNTER — Other Ambulatory Visit: Payer: Self-pay

## 2023-08-27 ENCOUNTER — Other Ambulatory Visit: Payer: Self-pay

## 2023-09-03 ENCOUNTER — Encounter: Payer: Self-pay | Admitting: Family Medicine

## 2023-09-23 ENCOUNTER — Other Ambulatory Visit (HOSPITAL_COMMUNITY): Payer: Self-pay

## 2023-10-27 ENCOUNTER — Other Ambulatory Visit: Payer: Self-pay

## 2023-10-27 ENCOUNTER — Other Ambulatory Visit (HOSPITAL_COMMUNITY): Payer: Self-pay

## 2023-11-03 ENCOUNTER — Other Ambulatory Visit: Payer: Self-pay

## 2023-11-12 ENCOUNTER — Encounter: Payer: 59 | Admitting: Family Medicine

## 2023-11-25 ENCOUNTER — Other Ambulatory Visit (HOSPITAL_COMMUNITY): Payer: Self-pay

## 2023-11-25 ENCOUNTER — Ambulatory Visit: Payer: 59 | Admitting: Family Medicine

## 2023-11-25 ENCOUNTER — Encounter: Payer: Self-pay | Admitting: Pharmacist

## 2023-11-25 ENCOUNTER — Other Ambulatory Visit: Payer: Self-pay

## 2023-11-25 ENCOUNTER — Encounter: Payer: Self-pay | Admitting: Family Medicine

## 2023-11-25 VITALS — BP 107/63 | HR 81 | Temp 98.1°F | Ht 63.0 in | Wt 227.0 lb

## 2023-11-25 DIAGNOSIS — Z7985 Long-term (current) use of injectable non-insulin antidiabetic drugs: Secondary | ICD-10-CM

## 2023-11-25 DIAGNOSIS — E785 Hyperlipidemia, unspecified: Secondary | ICD-10-CM | POA: Diagnosis not present

## 2023-11-25 DIAGNOSIS — F331 Major depressive disorder, recurrent, moderate: Secondary | ICD-10-CM

## 2023-11-25 DIAGNOSIS — E1165 Type 2 diabetes mellitus with hyperglycemia: Secondary | ICD-10-CM | POA: Diagnosis not present

## 2023-11-25 DIAGNOSIS — E1169 Type 2 diabetes mellitus with other specified complication: Secondary | ICD-10-CM

## 2023-11-25 DIAGNOSIS — Z0001 Encounter for general adult medical examination with abnormal findings: Secondary | ICD-10-CM | POA: Diagnosis not present

## 2023-11-25 DIAGNOSIS — Z0000 Encounter for general adult medical examination without abnormal findings: Secondary | ICD-10-CM

## 2023-11-25 LAB — CBC WITH DIFFERENTIAL/PLATELET
Basophils Absolute: 0 10*3/uL (ref 0.0–0.2)
Basos: 0 %
EOS (ABSOLUTE): 0.2 10*3/uL (ref 0.0–0.4)
Eos: 1 %
Hematocrit: 44.2 % (ref 34.0–46.6)
Hemoglobin: 14.8 g/dL (ref 11.1–15.9)
Immature Grans (Abs): 0 10*3/uL (ref 0.0–0.1)
Immature Granulocytes: 0 %
Lymphocytes Absolute: 2.8 10*3/uL (ref 0.7–3.1)
Lymphs: 25 %
MCH: 31 pg (ref 26.6–33.0)
MCHC: 33.5 g/dL (ref 31.5–35.7)
MCV: 93 fL (ref 79–97)
Monocytes Absolute: 0.6 10*3/uL (ref 0.1–0.9)
Monocytes: 5 %
Neutrophils Absolute: 7.7 10*3/uL — ABNORMAL HIGH (ref 1.4–7.0)
Neutrophils: 69 %
Platelets: 298 10*3/uL (ref 150–450)
RBC: 4.77 x10E6/uL (ref 3.77–5.28)
RDW: 12.6 % (ref 11.7–15.4)
WBC: 11.3 10*3/uL — ABNORMAL HIGH (ref 3.4–10.8)

## 2023-11-25 LAB — CMP14+EGFR
ALT: 12 [IU]/L (ref 0–32)
AST: 12 [IU]/L (ref 0–40)
Albumin: 4 g/dL (ref 3.9–4.9)
Alkaline Phosphatase: 115 [IU]/L (ref 44–121)
BUN/Creatinine Ratio: 18 (ref 9–23)
BUN: 13 mg/dL (ref 6–24)
Bilirubin Total: 0.3 mg/dL (ref 0.0–1.2)
CO2: 23 mmol/L (ref 20–29)
Calcium: 9.2 mg/dL (ref 8.7–10.2)
Chloride: 101 mmol/L (ref 96–106)
Creatinine, Ser: 0.72 mg/dL (ref 0.57–1.00)
Globulin, Total: 2.5 g/dL (ref 1.5–4.5)
Glucose: 138 mg/dL — ABNORMAL HIGH (ref 70–99)
Potassium: 3.8 mmol/L (ref 3.5–5.2)
Sodium: 139 mmol/L (ref 134–144)
Total Protein: 6.5 g/dL (ref 6.0–8.5)
eGFR: 107 mL/min/{1.73_m2} (ref >59)

## 2023-11-25 LAB — LIPID PANEL
Chol/HDL Ratio: 3.5 {ratio} (ref 0.0–4.4)
Cholesterol, Total: 137 mg/dL (ref 100–199)
HDL: 39 mg/dL — ABNORMAL LOW (ref >39)
LDL Chol Calc (NIH): 69 mg/dL (ref 0–99)
Triglycerides: 173 mg/dL — ABNORMAL HIGH (ref 0–149)
VLDL Cholesterol Cal: 29 mg/dL (ref 5–40)

## 2023-11-25 LAB — TSH: TSH: 2.43 u[IU]/mL (ref 0.450–4.500)

## 2023-11-25 LAB — BAYER DCA HB A1C WAIVED: HB A1C (BAYER DCA - WAIVED): 6 % — ABNORMAL HIGH (ref 4.8–5.6)

## 2023-11-25 MED ORDER — FREESTYLE LIBRE 3 PLUS SENSOR MISC
6 refills | Status: AC
  Filled 2023-11-25: qty 2, 28d supply, fill #0
  Filled 2023-12-22: qty 2, 28d supply, fill #1
  Filled 2024-02-02: qty 2, 28d supply, fill #2
  Filled 2024-04-06: qty 2, 28d supply, fill #3
  Filled 2024-07-13: qty 2, 28d supply, fill #4
  Filled 2024-07-28: qty 2, 28d supply, fill #5
  Filled 2024-08-16: qty 2, 30d supply, fill #5

## 2023-11-25 NOTE — Progress Notes (Signed)
 Complete physical exam  Patient: Lindsay [name]   DOB: 1981-01-19   42 y.o. Female  MRN: [id number]  Subjective:    Chief Complaint  Patient presents with   Annual Exam    Lindsay [name] is a 43 y.o. female who presents today for a complete physical exam. She reports consuming a general diet. The patient does not participate in regular exercise at present. She generally feels well. She reports sleeping well. She does have additional problems to discuss today.    Is having hypoglycemia events 1x a week on average. Sometimes in the middle of the night.  Feels jittery and shaking with these episodes. Sometimes doesn't have her meter with her to check her blood sugar. Has had readings as low as 47. Eating regularly. She is interested in a CGM.   Most recent fall risk assessment:    11/25/2023    8:21 AM  Fall Risk   Falls in the past year? 0     Most recent depression screenings:    11/25/2023    8:21 AM 05/08/2023   11:03 AM 02/05/2023    8:40 AM  Depression screen PHQ 2/9  Decreased Interest 0 0 0  Down, Depressed, Hopeless 0 0 0  PHQ - 2 Score 0 0 0  Altered sleeping 0 2 0  Tired, decreased energy 0 0 0  Change in appetite 0 1 0  Feeling bad or failure about yourself  0 0 0  Trouble concentrating 1 0 2  Moving slowly or fidgety/restless 0 0 0  Suicidal thoughts 0 0 0  PHQ-9 Score 1 3 2   Difficult doing work/chores Not difficult at all Not difficult at all Somewhat difficult     Vision:Within last year and Dental: No current dental problems and Receives regular dental care  Past Medical History:  Diagnosis Date   ADD (attention deficit disorder)    history   Anxiety    History of gestational diabetes       Patient Care Team: Gabriel Earing, FNP as PCP - General (Family Medicine)   Outpatient Medications Prior to Visit  Medication Sig   ALPRAZolam (XANAX) 0.25 MG tablet Take 1 tablet (0.25 mg total) by mouth 2 (two) times daily as needed for  anxiety.   Blood Glucose Monitoring Suppl DEVI 1 each by Does not apply route in the morning, at noon, and at bedtime. May substitute to any manufacturer covered by patient's insurance. E11.9   citalopram (CELEXA) 40 MG tablet Take 1 tablet (40 mg total) by mouth daily.   glucose blood (FREESTYLE LITE) test strip USE IN THE MORNING, AT NOON, AND AT BEDTIME. E11.9   rosuvastatin (CRESTOR) 10 MG tablet Take 1 tablet (10 mg total) by mouth daily.   Semaglutide, 1 MG/DOSE, 4 MG/3ML SOPN Inject 1 mg as directed once a week.   No facility-administered medications prior to visit.    ROS Negative unless specially indicated above in HPI.     Objective:     BP 107/63   Pulse 81   Temp 98.1 F (36.7 C) (Temporal)   Ht 5\' 3"  (1.6 m)   Wt 227 lb (103 kg)   SpO2 96%   BMI 40.21 kg/m  Wt Readings from Last 3 Encounters:  11/25/23 227 lb (103 kg)  05/08/23 216 lb 4 oz (98.1 kg)  02/05/23 222 lb (100.7 kg)      Physical Exam Vitals and nursing note reviewed.  Constitutional:  General: She is not in acute distress.    Appearance: She is obese. She is not ill-appearing, toxic-appearing or diaphoretic.  HENT:     Head: Normocephalic.     Right Ear: Tympanic membrane, ear canal and external ear normal.     Left Ear: Tympanic membrane, ear canal and external ear normal.     Nose: Nose normal.     Mouth/Throat:     Mouth: Mucous membranes are moist.     Pharynx: Oropharynx is clear.  Eyes:     Extraocular Movements: Extraocular movements intact.     Conjunctiva/sclera: Conjunctivae normal.     Pupils: Pupils are equal, round, and reactive to light.  Neck:     Thyroid: No thyroid mass, thyromegaly or thyroid tenderness.  Cardiovascular:     Rate and Rhythm: Normal rate and regular rhythm.     Pulses: Normal pulses.     Heart sounds: Normal heart sounds. No murmur heard.    No friction rub. No gallop.  Pulmonary:     Effort: Pulmonary effort is normal.     Breath sounds: Normal  breath sounds.  Abdominal:     General: Bowel sounds are normal. There is no distension.     Palpations: Abdomen is soft. There is no mass.     Tenderness: There is no abdominal tenderness. There is no guarding.  Musculoskeletal:        General: No tenderness.     Cervical back: Normal range of motion and neck supple. No tenderness.     Right lower leg: No edema.     Left lower leg: No edema.  Skin:    General: Skin is warm and dry.     Capillary Refill: Capillary refill takes less than 2 seconds.     Findings: No lesion or rash.  Neurological:     General: No focal deficit present.     Mental Status: She is alert and oriented to person, place, and time.     Cranial Nerves: No cranial nerve deficit.     Motor: No weakness.     Gait: Gait normal.  Psychiatric:        Mood and Affect: Mood normal.        Behavior: Behavior normal.        Thought Content: Thought content normal.        Judgment: Judgment normal.      No results found for any visits on 11/25/23.     Assessment & Plan:    Routine Health Maintenance and Physical Exam  Lindsay [name] was seen today for annual exam.  Diagnoses and all orders for this visit:  Routine general medical examination at a health care facility  Type 2 diabetes mellitus with hyperglycemia, without long-term current use of insulin (HCC) A1c at goal. CGM discussed and ordered. Discussed decreasing ozempic to 0.5 gm weekly if hypoglycemia episodes persist.   -     Microalbumin / creatinine urine ratio -     Bayer DCA Hb A1c Waived -     Continuous Glucose Sensor (FREESTYLE LIBRE 3 PLUS SENSOR) MISC; Change sensor every 15 days.  Hyperlipidemia associated with type 2 diabetes mellitus (HCC) On statin. Fasting labs pending.  -     CMP14+EGFR -     Lipid panel  Morbid obesity (HCC) Diet, exercise, weight loss.  -     CBC with Differential/Platelet -     TSH  Depression, recurrent Well controlled without medication currently.    Immunization  History  Administered Date(s) Administered   Influenza Split 07/20/2014, 07/06/2018   Influenza, Seasonal, Injecte, Preservative Fre 07/14/2019   Influenza,inj,Quad PF,6+ Mos 07/06/2022   Influenza-Unspecified 07/12/2015, 07/17/2015, 07/14/2019, 08/04/2023   PFIZER(Purple Top)SARS-COV-2 Vaccination 08/19/2020, 09/09/2020   PPD Test 08/31/2020   Tdap 06/06/2021    Health Maintenance  Topic Date Due   OPHTHALMOLOGY EXAM  Never done   Diabetic kidney evaluation - Urine ACR  11/08/2023   HEMOGLOBIN A1C  11/08/2023   COVID-19 Vaccine (3 - Pfizer risk series) 02/21/2024 (Originally 10/07/2020)   Cervical Cancer Screening (HPV/Pap Cotest)  11/24/2024 (Originally 02/11/2015)   Pneumococcal Vaccine 68-56 Years old (1 of 2 - PCV) 11/24/2024 (Originally 12/10/1986)   Hepatitis C Screening  11/24/2024 (Originally 12/10/1998)   HIV Screening  11/24/2024 (Originally 12/10/1995)   Diabetic kidney evaluation - eGFR measurement  05/07/2024   FOOT EXAM  05/07/2024   DTaP/Tdap/Td (2 - Td or Tdap) 06/07/2031   INFLUENZA VACCINE  Completed   HPV VACCINES  Aged Out    Discussed health benefits of physical activity, and encouraged her to engage in regular exercise appropriate for her age and condition.  Problem List Items Addressed This Visit       Endocrine   Type 2 diabetes mellitus with hyperglycemia, without long-term current use of insulin (HCC)   Relevant Medications   Continuous Glucose Sensor (FREESTYLE LIBRE 3 PLUS SENSOR) MISC   Other Relevant Orders   Microalbumin / creatinine urine ratio   Bayer DCA Hb A1c Waived   Hyperlipidemia associated with type 2 diabetes mellitus (HCC)   Relevant Orders   CMP14+EGFR   Lipid panel     Other   Moderate recurrent major depression (HCC)   Morbid obesity (HCC)   Relevant Orders   CBC with Differential/Platelet   TSH   Other Visit Diagnoses       Routine general medical examination at a health care facility    -  Primary       Return in about 6 months (around 05/24/2024) for chronic follow up.   The patient indicates understanding of these issues and agrees with the plan.  Gabriel Earing, FNP

## 2023-11-25 NOTE — Patient Instructions (Signed)

## 2023-11-26 LAB — MICROALBUMIN / CREATININE URINE RATIO
Creatinine, Urine: 132.8 mg/dL
Microalb/Creat Ratio: 5 mg/g{creat} (ref 0–29)
Microalbumin, Urine: 7.3 ug/mL

## 2023-11-27 ENCOUNTER — Other Ambulatory Visit: Payer: Self-pay | Admitting: Family Medicine

## 2023-11-27 DIAGNOSIS — D72829 Elevated white blood cell count, unspecified: Secondary | ICD-10-CM

## 2023-12-04 ENCOUNTER — Other Ambulatory Visit: Payer: 59

## 2023-12-04 DIAGNOSIS — D72829 Elevated white blood cell count, unspecified: Secondary | ICD-10-CM | POA: Diagnosis not present

## 2023-12-04 LAB — CBC WITH DIFFERENTIAL/PLATELET
Basophils Absolute: 0 10*3/uL (ref 0.0–0.2)
Basos: 0 %
EOS (ABSOLUTE): 0.1 10*3/uL (ref 0.0–0.4)
Eos: 1 %
Hematocrit: 43.1 % (ref 34.0–46.6)
Hemoglobin: 14.3 g/dL (ref 11.1–15.9)
Immature Grans (Abs): 0 10*3/uL (ref 0.0–0.1)
Immature Granulocytes: 0 %
Lymphocytes Absolute: 2.2 10*3/uL (ref 0.7–3.1)
Lymphs: 24 %
MCH: 30.8 pg (ref 26.6–33.0)
MCHC: 33.2 g/dL (ref 31.5–35.7)
MCV: 93 fL (ref 79–97)
Monocytes Absolute: 0.5 10*3/uL (ref 0.1–0.9)
Monocytes: 6 %
Neutrophils Absolute: 6 10*3/uL (ref 1.4–7.0)
Neutrophils: 69 %
Platelets: 290 10*3/uL (ref 150–450)
RBC: 4.65 x10E6/uL (ref 3.77–5.28)
RDW: 13.1 % (ref 11.7–15.4)
WBC: 8.9 10*3/uL (ref 3.4–10.8)

## 2023-12-07 ENCOUNTER — Encounter: Payer: Self-pay | Admitting: Family Medicine

## 2023-12-22 ENCOUNTER — Other Ambulatory Visit (HOSPITAL_COMMUNITY): Payer: Self-pay

## 2023-12-23 ENCOUNTER — Other Ambulatory Visit: Payer: Self-pay

## 2024-02-02 ENCOUNTER — Other Ambulatory Visit: Payer: Self-pay | Admitting: Family Medicine

## 2024-02-02 ENCOUNTER — Other Ambulatory Visit (HOSPITAL_COMMUNITY): Payer: Self-pay

## 2024-02-02 ENCOUNTER — Other Ambulatory Visit: Payer: Self-pay

## 2024-02-02 DIAGNOSIS — E1165 Type 2 diabetes mellitus with hyperglycemia: Secondary | ICD-10-CM

## 2024-02-02 MED ORDER — OZEMPIC (1 MG/DOSE) 4 MG/3ML ~~LOC~~ SOPN
1.0000 mg | PEN_INJECTOR | SUBCUTANEOUS | 1 refills | Status: DC
  Filled 2024-02-02: qty 9, 84d supply, fill #0
  Filled 2024-04-06 – 2024-04-12 (×2): qty 9, 84d supply, fill #1

## 2024-04-06 ENCOUNTER — Other Ambulatory Visit: Payer: Self-pay | Admitting: Family Medicine

## 2024-04-06 DIAGNOSIS — F331 Major depressive disorder, recurrent, moderate: Secondary | ICD-10-CM

## 2024-04-06 DIAGNOSIS — F411 Generalized anxiety disorder: Secondary | ICD-10-CM

## 2024-04-07 ENCOUNTER — Other Ambulatory Visit: Payer: Self-pay

## 2024-04-07 ENCOUNTER — Other Ambulatory Visit (HOSPITAL_COMMUNITY): Payer: Self-pay

## 2024-04-07 MED ORDER — ROSUVASTATIN CALCIUM 10 MG PO TABS
10.0000 mg | ORAL_TABLET | Freq: Every day | ORAL | 0 refills | Status: DC
Start: 2024-04-07 — End: 2024-06-20
  Filled 2024-04-07: qty 90, 90d supply, fill #0

## 2024-04-07 MED ORDER — CITALOPRAM HYDROBROMIDE 40 MG PO TABS
40.0000 mg | ORAL_TABLET | Freq: Every day | ORAL | 0 refills | Status: DC
  Filled 2024-04-07: qty 90, 90d supply, fill #0

## 2024-04-12 ENCOUNTER — Other Ambulatory Visit: Payer: Self-pay

## 2024-04-12 ENCOUNTER — Other Ambulatory Visit (HOSPITAL_COMMUNITY): Payer: Self-pay

## 2024-04-27 ENCOUNTER — Encounter: Payer: Self-pay | Admitting: Family Medicine

## 2024-04-28 ENCOUNTER — Ambulatory Visit: Admitting: Family Medicine

## 2024-05-25 ENCOUNTER — Ambulatory Visit: Payer: 59 | Admitting: Family Medicine

## 2024-06-20 ENCOUNTER — Encounter: Payer: Self-pay | Admitting: Family Medicine

## 2024-06-20 ENCOUNTER — Other Ambulatory Visit: Payer: Self-pay

## 2024-06-20 ENCOUNTER — Other Ambulatory Visit (HOSPITAL_COMMUNITY): Payer: Self-pay

## 2024-06-20 ENCOUNTER — Ambulatory Visit: Admitting: Family Medicine

## 2024-06-20 VITALS — BP 111/74 | HR 79 | Temp 98.3°F | Ht 63.0 in | Wt 228.4 lb

## 2024-06-20 DIAGNOSIS — R41840 Attention and concentration deficit: Secondary | ICD-10-CM

## 2024-06-20 DIAGNOSIS — E1165 Type 2 diabetes mellitus with hyperglycemia: Secondary | ICD-10-CM | POA: Diagnosis not present

## 2024-06-20 DIAGNOSIS — E1169 Type 2 diabetes mellitus with other specified complication: Secondary | ICD-10-CM

## 2024-06-20 DIAGNOSIS — F411 Generalized anxiety disorder: Secondary | ICD-10-CM

## 2024-06-20 DIAGNOSIS — E785 Hyperlipidemia, unspecified: Secondary | ICD-10-CM

## 2024-06-20 DIAGNOSIS — F331 Major depressive disorder, recurrent, moderate: Secondary | ICD-10-CM

## 2024-06-20 LAB — BAYER DCA HB A1C WAIVED: HB A1C (BAYER DCA - WAIVED): 5.8 % — ABNORMAL HIGH (ref 4.8–5.6)

## 2024-06-20 MED ORDER — ROSUVASTATIN CALCIUM 10 MG PO TABS
10.0000 mg | ORAL_TABLET | Freq: Every day | ORAL | 3 refills | Status: AC
  Filled 2024-06-20: qty 90, 90d supply, fill #0

## 2024-06-20 MED ORDER — CITALOPRAM HYDROBROMIDE 40 MG PO TABS
40.0000 mg | ORAL_TABLET | Freq: Every day | ORAL | 3 refills | Status: AC
  Filled 2024-06-20: qty 90, 90d supply, fill #0

## 2024-06-20 MED ORDER — SEMAGLUTIDE (2 MG/DOSE) 8 MG/3ML ~~LOC~~ SOPN
2.0000 mg | PEN_INJECTOR | SUBCUTANEOUS | 3 refills | Status: AC
  Filled 2024-06-20 – 2024-06-30 (×2): qty 9, 84d supply, fill #0
  Filled 2024-10-05: qty 9, 84d supply, fill #1

## 2024-06-20 NOTE — Progress Notes (Signed)
 Established Patient Office Visit  Subjective   Patient ID: Lindsay [name], female    DOB: 07/15/1981  Age: 43 y.o. MRN: [id number]  Chief Complaint  Patient presents with   Medical Management of Chronic Issues    HPI  History of Present Illness   Lindsay [name] is a 43 year old female with type 2 diabetes who presents for follow-up and A1c testing.  Glycemic control and antihyperglycemic therapy - Type 2 diabetes managed with Ozempic  1 mg and Libre continuous glucose monitoring device - Average blood glucose approximately 135 mg/dL - Estimated J8r of 3.3% over the past 14 days per Herlene - Time in range 92-93% - No blood glucose readings exceeding 250 mg/dL - Occasional nighttime nausea, improved - No significant decrease in appetite with Ozempic  - Diet recently disrupted due to vacation otherwise diet is pretty good - No regular exercise  Neurocognitive and mood symptoms - Difficulty focusing and organizing tasks, worsening over the past 6-12 months - Feels overwhelmed and experiences lack of motivation - Trouble sleeping with racing thoughts at night - Low energy and trouble concentrating - Procrastination and last-minute panic to complete tasks - Similar symptoms present since childhood - Observes similar behavior in her daughter         06/20/2024   11:04 AM 11/25/2023    8:21 AM 05/08/2023   11:03 AM  Depression screen PHQ 2/9  Decreased Interest 0 0 0  Down, Depressed, Hopeless 0 0 0  PHQ - 2 Score 0 0 0  Altered sleeping 2 0 2  Tired, decreased energy 2 0 0  Change in appetite 1 0 1  Feeling bad or failure about yourself  0 0 0  Trouble concentrating 3 1 0  Moving slowly or fidgety/restless 0 0 0  Suicidal thoughts 0 0 0  PHQ-9 Score 8 1 3   Difficult doing work/chores Somewhat difficult Not difficult at all Not difficult at all      06/20/2024   11:05 AM 11/25/2023    8:23 AM 05/08/2023   11:02 AM 02/05/2023    8:40 AM  GAD 7 : Generalized  Anxiety Score  Nervous, Anxious, on Edge 0 0 0 1  Control/stop worrying 0 0 0 0  Worry too much - different things 0 0 0 0  Trouble relaxing 0 0 0 0  Restless 1 0 0 0  Easily annoyed or irritable 2 0 0 1  Afraid - awful might happen 0 0 0 0  Total GAD 7 Score 3 0 0 2  Anxiety Difficulty Somewhat difficult Not difficult at all Not difficult at all Somewhat difficult       ROS As per HPI.    Objective:     BP 111/74   Pulse 79   Temp 98.3 F (36.8 C) (Temporal)   Ht 5' 3 (1.6 m)   Wt 228 lb 6.4 oz (103.6 kg)   SpO2 97%   BMI 40.46 kg/m  Wt Readings from Last 3 Encounters:  06/20/24 228 lb 6.4 oz (103.6 kg)  11/25/23 227 lb (103 kg)  05/08/23 216 lb 4 oz (98.1 kg)     Physical Exam Vitals and nursing note reviewed.  Constitutional:      General: She is not in acute distress.    Appearance: She is obese. She is not ill-appearing, toxic-appearing or diaphoretic.  Cardiovascular:     Rate and Rhythm: Normal rate and regular rhythm.     Pulses: Normal pulses.  Heart sounds: Normal heart sounds. No murmur heard. Pulmonary:     Effort: Pulmonary effort is normal. No respiratory distress.     Breath sounds: Normal breath sounds.  Abdominal:     General: Bowel sounds are normal. There is no distension.     Palpations: Abdomen is soft. There is no mass.     Tenderness: There is no abdominal tenderness. There is no guarding or rebound.  Musculoskeletal:     Right lower leg: No edema.     Left lower leg: No edema.  Skin:    General: Skin is warm and dry.  Neurological:     General: No focal deficit present.     Mental Status: She is alert and oriented to person, place, and time.  Psychiatric:        Mood and Affect: Mood normal.        Behavior: Behavior normal.      Diabetic Foot Exam - Simple   Simple Foot Form Diabetic Foot exam was performed with the following findings: Yes 06/20/2024 11:21 AM  Visual Inspection No deformities, no ulcerations, no other  skin breakdown bilaterally: Yes Sensation Testing Intact to touch and monofilament testing bilaterally: Yes Pulse Check Posterior Tibialis and Dorsalis pulse intact bilaterally: Yes Comments     No results found for any visits on 06/20/24.    The 10-year ASCVD risk score (Arnett DK, et al., 2019) is: 0.9%    Assessment & Plan:   Lindsay [name] was seen today for medical management of chronic issues.  Diagnoses and all orders for this visit:  Type 2 diabetes mellitus with hyperglycemia, without long-term current use of insulin  (HCC) -     Bayer DCA Hb A1c Waived -     Semaglutide , 2 MG/DOSE, 8 MG/3ML SOPN; Inject 2 mg as directed once a week.  Hyperlipidemia associated with type 2 diabetes mellitus (HCC) -     rosuvastatin  (CRESTOR ) 10 MG tablet; Take 1 tablet (10 mg total) by mouth daily.  Generalized anxiety disorder -     citalopram  (CELEXA ) 40 MG tablet; Take 1 tablet (40 mg total) by mouth daily.  Morbid obesity (HCC)  Moderate recurrent major depression (HCC) -     citalopram  (CELEXA ) 40 MG tablet; Take 1 tablet (40 mg total) by mouth daily.  Attention deficit -     Ambulatory referral to Psychiatry      Morbid obesity due to excess calories Ozempic  has not significantly decreased appetite.  - Increase Ozempic  to 2 mg.  Type 2 diabetes mellitus Diabetes well-controlled with A1c of 6.0 previously and estimated A1c of 6.2-6.3. Blood glucose levels well-managed with Ozempic  1 mg. Discussed increasing Ozempic  to 2 mg for weight loss benefits. - Order A1c test today. - Increase Ozempic  to 2 mg. - Monitor for nausea with increased Ozempic  dose. - Instruct to report any significant hypoglycemia or hyperglycemia. - Schedule follow-up in six months for physical and cholesterol check.  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 Referral for formal testing warranted. - Refer to psychiatry  for attention deficit disorder testing.   Anxiety and depression Reports well managed with celexa .       Return in about 6 months (around 12/18/2024) for CPE.  The patient indicates understanding of these issues and agrees with the plan.    Lindsay [name] CHRISTELLA Search, FNP

## 2024-06-22 ENCOUNTER — Other Ambulatory Visit (HOSPITAL_COMMUNITY): Payer: Self-pay

## 2024-06-22 ENCOUNTER — Ambulatory Visit: Payer: Self-pay | Admitting: Family Medicine

## 2024-06-23 ENCOUNTER — Other Ambulatory Visit (HOSPITAL_COMMUNITY): Payer: Self-pay

## 2024-06-24 ENCOUNTER — Other Ambulatory Visit (HOSPITAL_COMMUNITY): Payer: Self-pay

## 2024-06-24 ENCOUNTER — Encounter (HOSPITAL_COMMUNITY): Payer: Self-pay

## 2024-06-28 ENCOUNTER — Other Ambulatory Visit: Payer: Self-pay

## 2024-06-28 ENCOUNTER — Telehealth: Payer: Self-pay

## 2024-06-28 ENCOUNTER — Other Ambulatory Visit (HOSPITAL_COMMUNITY): Payer: Self-pay

## 2024-06-28 NOTE — Telephone Encounter (Signed)
 Pharmacy Patient Advocate Encounter   Received notification from Onbase that prior authorization for Ozempic  (2 MG/DOSE) 8MG /3ML pen-injectors  is required/requested.   Insurance verification completed.   The patient is insured through [facility] [place]  .   Per test claim: PA required; PA submitted to above mentioned insurance via Latent Key/confirmation #/EOC [id number] Status is pending

## 2024-06-29 ENCOUNTER — Other Ambulatory Visit (HOSPITAL_COMMUNITY): Payer: Self-pay

## 2024-06-30 ENCOUNTER — Other Ambulatory Visit (HOSPITAL_COMMUNITY): Payer: Self-pay

## 2024-06-30 ENCOUNTER — Other Ambulatory Visit: Payer: Self-pay

## 2024-06-30 NOTE — Telephone Encounter (Signed)
 Pharmacy Patient Advocate Encounter  Received notification from [facility] [place]  that Prior Authorization for Ozempic  (2 MG/DOSE) 8MG /3ML pen-injectors  has been APPROVED from 06/29/24 to 06/29/25. Ran test claim, Copay is $25. This test claim was processed through [facility]- copay amounts may vary at other pharmacies due to pharmacy/plan contracts, or as the patient moves through the different stages of their insurance plan.   PA #/Case ID/Reference #: [id number]

## 2024-07-13 ENCOUNTER — Other Ambulatory Visit (HOSPITAL_COMMUNITY): Payer: Self-pay

## 2024-07-14 ENCOUNTER — Other Ambulatory Visit: Payer: Self-pay

## 2024-07-28 ENCOUNTER — Other Ambulatory Visit: Payer: Self-pay

## 2024-07-28 ENCOUNTER — Other Ambulatory Visit (HOSPITAL_COMMUNITY): Payer: Self-pay

## 2024-08-16 ENCOUNTER — Other Ambulatory Visit (HOSPITAL_COMMUNITY): Payer: Self-pay

## 2024-09-02 ENCOUNTER — Telehealth: Admitting: Family Medicine

## 2024-09-02 DIAGNOSIS — M545 Low back pain, unspecified: Secondary | ICD-10-CM

## 2024-09-02 MED ORDER — CYCLOBENZAPRINE HCL 10 MG PO TABS: 10.0000 mg | ORAL_TABLET | Freq: Three times a day (TID) | ORAL | 0 refills | Status: AC | PRN

## 2024-09-02 MED ORDER — NAPROXEN 500 MG PO TABS: 500.0000 mg | ORAL_TABLET | Freq: Two times a day (BID) | ORAL | 0 refills | Status: AC

## 2024-09-02 NOTE — Progress Notes (Signed)
 We are sorry that you are not feeling well.  Here is how we plan to help!  Based on what you have shared with me it looks like you mostly have acute back pain.  Acute back pain is defined as musculoskeletal pain that can resolve in 1-3 weeks with conservative treatment.  I have prescribed Naprosyn 500 mg take one by mouth twice a day non-steroid anti-inflammatory (NSAID) as well as Flexeril  10 mg every eight hours as needed which is a muscle relaxer  Some patients experience stomach irritation or in increased heartburn with anti-inflammatory drugs.  Please keep in mind that muscle relaxer's can cause fatigue and should not be taken while at work or driving.  Back pain is very common.  The pain often gets better over time.  The cause of back pain is usually not dangerous.  Most people can learn to manage their back pain on their own. Please be advised to take Naprosyn and Celexa  with caution as it can cause GI bleeding.  Please monitor for signs of blood in stool or vomiting blood.   Home Care Stay active.  Start with short walks on flat ground if you can.  Try to walk farther each day. Do not sit, drive or stand in one place for more than 30 minutes.  Do not stay in bed. Do not avoid exercise or work.  Activity can help your back heal faster. Be careful when you bend or lift an object.  Bend at your knees, keep the object close to you, and do not twist. Sleep on a firm mattress.  Lie on your side, and bend your knees.  If you lie on your back, put a pillow under your knees. Only take medicines as told by your doctor. Put ice on the injured area. Put ice in a plastic bag Place a towel between your skin and the bag Leave the ice on for 15-20 minutes, 3-4 times a day for the first 2-3 days. 210 After that, you can switch between ice and heat packs. Ask your doctor about back exercises or massage. Avoid feeling anxious or stressed.  Find good ways to deal with stress, such as exercise.  Get Help  Right Way If: Your pain does not go away with rest or medicine. Your pain does not go away in 1 week. You have new problems. You do not feel well. The pain spreads into your legs. You cannot control when you poop (bowel movement) or pee (urinate) You feel sick to your stomach (nauseous) or throw up (vomit) You have belly (abdominal) pain. You feel like you may pass out (faint). If you develop a fever.  Make Sure you: Understand these instructions. Will watch your condition Will get help right away if you are not doing well or get worse.  Your e-visit answers were reviewed by a board certified advanced clinical practitioner to complete your personal care plan.  Depending on the condition, your plan could have included both over the counter or prescription medications.  If there is a problem please reply  once you have received a response from your provider.  Your safety is important to us .  If you have drug allergies check your prescription carefully.    You can use MyChart to ask questions about today's visit, request a non-urgent call back, or ask for a work or school excuse for 24 hours related to this e-Visit. If it has been greater than 24 hours you will need to follow up with your  provider, or enter a new e-Visit to address those concerns.  You will get an e-mail in the next two days asking about your experience.  I hope that your e-visit has been valuable and will speed your recovery. Thank you for using e-visits.   I have spent 5 minutes in review of e-visit questionnaire, review and updating patient chart, medical decision making and response to patient.   Roosvelt Mater, PA-C

## 2024-10-05 ENCOUNTER — Other Ambulatory Visit (HOSPITAL_COMMUNITY): Payer: Self-pay

## 2024-10-10 ENCOUNTER — Encounter: Payer: Self-pay | Admitting: Pharmacist

## 2024-10-10 ENCOUNTER — Other Ambulatory Visit: Payer: Self-pay

## 2024-10-11 ENCOUNTER — Other Ambulatory Visit (HOSPITAL_COMMUNITY): Payer: Self-pay

## 2024-12-22 ENCOUNTER — Encounter: Payer: Self-pay | Admitting: Family Medicine
